# Patient Record
Sex: Male | Born: 1950 | Race: Black or African American | Hispanic: No | Marital: Single | Smoking: Current every day smoker
Health system: Southern US, Community
[De-identification: ages and names within clinical notes are randomized; demographics above are authoritative.]

---

## 2005-08-05 DIAGNOSIS — C189 Malignant neoplasm of colon, unspecified: Secondary | ICD-10-CM

## 2005-08-05 HISTORY — DX: Malignant neoplasm of colon, unspecified: C18.9

## 2006-02-06 ENCOUNTER — Emergency Department (HOSPITAL_COMMUNITY): Admission: EM | Admit: 2006-02-06 | Discharge: 2006-02-06 | Payer: Self-pay | Admitting: Emergency Medicine

## 2006-02-07 ENCOUNTER — Emergency Department (HOSPITAL_COMMUNITY): Admission: EM | Admit: 2006-02-07 | Discharge: 2006-02-07 | Payer: Self-pay | Admitting: Emergency Medicine

## 2006-02-13 ENCOUNTER — Emergency Department (HOSPITAL_COMMUNITY): Admission: EM | Admit: 2006-02-13 | Discharge: 2006-02-13 | Payer: Self-pay | Admitting: Emergency Medicine

## 2009-06-15 ENCOUNTER — Emergency Department (HOSPITAL_COMMUNITY): Admission: EM | Admit: 2009-06-15 | Discharge: 2009-06-16 | Payer: Self-pay | Admitting: Emergency Medicine

## 2020-09-26 ENCOUNTER — Emergency Department (HOSPITAL_COMMUNITY): Payer: Medicare Other

## 2020-09-26 ENCOUNTER — Emergency Department (HOSPITAL_COMMUNITY)
Admission: EM | Admit: 2020-09-26 | Discharge: 2020-09-26 | Disposition: A | Discharge status: Home / Self Care | Payer: Medicare Other | Attending: Emergency Medicine | Admitting: Emergency Medicine

## 2020-09-26 DIAGNOSIS — G4730 Sleep apnea, unspecified: Secondary | ICD-10-CM | POA: Diagnosis not present

## 2020-09-26 DIAGNOSIS — T50901A Poisoning by unspecified drugs, medicaments and biological substances, accidental (unintentional), initial encounter: Secondary | ICD-10-CM

## 2020-09-26 DIAGNOSIS — T480X1A Poisoning by oxytocic drugs, accidental (unintentional), initial encounter: Secondary | ICD-10-CM | POA: Insufficient documentation

## 2020-09-26 LAB — CBC WITH DIFFERENTIAL/PLATELET
Abs Immature Granulocytes: 0.18 10*3/uL — ABNORMAL HIGH (ref 0.00–0.07)
Basophils Absolute: 0.1 10*3/uL (ref 0.0–0.1)
Basophils Relative: 1 %
Eosinophils Absolute: 0 10*3/uL (ref 0.0–0.5)
Eosinophils Relative: 0 %
HCT: 41.9 % (ref 39.0–52.0)
Hemoglobin: 13.5 g/dL (ref 13.0–17.0)
Immature Granulocytes: 1 %
Lymphocytes Relative: 5 %
Lymphs Abs: 0.6 10*3/uL — ABNORMAL LOW (ref 0.7–4.0)
MCH: 29.8 pg (ref 26.0–34.0)
MCHC: 32.2 g/dL (ref 30.0–36.0)
MCV: 92.5 fL (ref 80.0–100.0)
Monocytes Absolute: 0.7 10*3/uL (ref 0.1–1.0)
Monocytes Relative: 5 %
Neutro Abs: 12.3 10*3/uL — ABNORMAL HIGH (ref 1.7–7.7)
Neutrophils Relative %: 88 %
Platelets: 224 10*3/uL (ref 150–400)
RBC: 4.53 MIL/uL (ref 4.22–5.81)
RDW: 12.5 % (ref 11.5–15.5)
WBC: 13.9 10*3/uL — ABNORMAL HIGH (ref 4.0–10.5)
nRBC: 0 % (ref 0.0–0.2)

## 2020-09-26 LAB — COMPREHENSIVE METABOLIC PANEL
ALT: 16 U/L (ref 0–44)
AST: 19 U/L (ref 15–41)
Albumin: 3.7 g/dL (ref 3.5–5.0)
Alkaline Phosphatase: 73 U/L (ref 38–126)
Anion gap: 8 (ref 5–15)
BUN: 10 mg/dL (ref 8–23)
CO2: 28 mmol/L (ref 22–32)
Calcium: 8.8 mg/dL — ABNORMAL LOW (ref 8.9–10.3)
Chloride: 101 mmol/L (ref 98–111)
Creatinine, Ser: 0.98 mg/dL (ref 0.61–1.24)
GFR, Estimated: >60 mL/min (ref >60)
Glucose, Bld: 148 mg/dL — ABNORMAL HIGH (ref 70–99)
Potassium: 4 mmol/L (ref 3.5–5.1)
Sodium: 137 mmol/L (ref 135–145)
Total Bilirubin: 0.3 mg/dL (ref 0.3–1.2)
Total Protein: 6.6 g/dL (ref 6.5–8.1)

## 2020-09-26 LAB — RAPID URINE DRUG SCREEN, HOSP PERFORMED
Amphetamines: NOT DETECTED
Barbiturates: NOT DETECTED
Benzodiazepines: NOT DETECTED
Cocaine: POSITIVE — AB
Opiates: NOT DETECTED
Tetrahydrocannabinol: POSITIVE — AB

## 2020-09-26 LAB — ETHANOL: Alcohol, Ethyl (B): <10 mg/dL (ref <10)

## 2020-09-26 NOTE — ED Triage Notes (Addendum)
Pt arrives from home via EMS. Pt's wife called EMS due to pt becoming altered, then unresponsive followed by agonal breathing. EMS reports agonal breathing, pin point pupils and unresponsiveness upon their arrival. EMS initiated BVM, 2 mg narcan IN. After several minutes pt woke up and is a/o x4 upon arrival to ED. Pt admits to accidental overdose on oxycodone though he states he only took 1/2 of a 10 mg tab. Pt is not prescribed oxycodone and wife is unaware that pt had taken medication or where he got it. Most recent vitals: BP 162/96 P 86 RR 18 CBG 150

## 2020-09-26 NOTE — ED Provider Notes (Signed)
[facility]ERGENCY DEPARTMENT Provider Note  CSN: [id number] Arrival date & time: 09/26/20 1505    History Chief Complaint  Patient presents with  . Ingestion    HPI  Philip Brooks is a 70 y.o. male with no significant PMH reports he took 1/4 of a friend's 10mg  oxycodone earlier this afternoon. He was found unresponsive by his wife who called EMS. EMS reports they found him with pinpoint pupils, agonal respirations, unresponsive. He was given Narcan with BVM until he regained consciousness. Unknown downtime but currently awake and alert without complaints. He states he is not a regular user of pain medications, does not use IV drugs. No other co-ingestions today. No SI.    No past medical history on file.  No family history on file.      Home Medications Prior to Admission medications   Not on File     Allergies    Patient has no known allergies.   Review of Systems   Review of Systems A comprehensive review of systems was completed and negative except as noted in HPI.    Physical Exam BP (!) 142/64 (BP Location: Right Arm)   Pulse 73   Temp 97.8 F (36.6 C) (Oral)   Resp 18   SpO2 90%   Physical Exam Vitals and nursing note reviewed.  Constitutional:      Appearance: Normal appearance.  HENT:     Head: Normocephalic and atraumatic.     Nose: Nose normal.     Mouth/Throat:     Mouth: Mucous membranes are moist.  Eyes:     Extraocular Movements: Extraocular movements intact.     Conjunctiva/sclera: Conjunctivae normal.  Cardiovascular:     Rate and Rhythm: Normal rate.  Pulmonary:     Effort: Pulmonary effort is normal.     Breath sounds: Normal breath sounds.  Abdominal:     General: Abdomen is flat.     Palpations: Abdomen is soft.     Tenderness: There is no abdominal tenderness.  Musculoskeletal:        General: No swelling. Normal range of motion.     Cervical back: Neck supple.  Skin:    General: Skin is warm and dry.  Neurological:      General: No focal deficit present.     Mental Status: He is alert.  Psychiatric:        Mood and Affect: Mood normal.      ED Results / Procedures / Treatments   Labs (all labs ordered are listed, but only abnormal results are displayed) Labs Reviewed  COMPREHENSIVE METABOLIC PANEL - Abnormal; Notable for the following components:      Result Value   Glucose, Bld 148 (*)    Calcium 8.8 (*)    All other components within normal limits  RAPID URINE DRUG SCREEN, HOSP PERFORMED - Abnormal; Notable for the following components:   Cocaine POSITIVE (*)    Tetrahydrocannabinol POSITIVE (*)    All other components within normal limits  CBC WITH DIFFERENTIAL/PLATELET - Abnormal; Notable for the following components:   WBC 13.9 (*)    Neutro Abs 12.3 (*)    Lymphs Abs 0.6 (*)    Abs Immature Granulocytes 0.18 (*)    All other components within normal limits  ETHANOL    EKG EKG Interpretation  Date/Time:  Tuesday September 26 2020 15:19:25 EST Ventricular Rate:  72 PR Interval:    QRS Duration: 166 QT Interval:  458 QTC Calculation: 502  R Axis:   41 Text Interpretation: Sinus rhythm Consider left atrial enlargement Left bundle branch block No old tracing to compare Confirmed by Calvert Cantor [phone]) on 09/26/2020 3:21:12 PM Also confirmed by Calvert Cantor [phone]), editor Hattie Perch [phone])  on 09/27/2020 6:46:41 AM   Radiology DG Chest 2 View  Result Date: 09/26/2020 CLINICAL DATA:  Overdose. EXAM: CHEST - 2 VIEW COMPARISON:  None. FINDINGS: The heart size and mediastinal contours are within normal limits. Both lungs are clear. The visualized skeletal structures are unremarkable. IMPRESSION: No active cardiopulmonary disease. Electronically Signed   By: Marijo Conception M.D.   On: 09/26/2020 16:35    Procedures Procedures  Medications Ordered in the ED Medications - No data to display   MDM Rules/Calculators/A&P MDM Patient now awake and alert after  accidental oral opiate overdose. Will check labs, CXR to check for aspiration and monitor for recurrence of somnolence when Narcan wears off.  ED Course  I have reviewed the triage vital signs and the nursing notes.  Pertinent labs & imaging results that were available during my care of the patient were reviewed by me and considered in my medical decision making (see chart for details).  Clinical Course as of 09/27/20 0759  Tue Sep 26, 2020  1714 CXR is normal.  [CS]  1638 CBC with mild leukocytosis. [CS]  4536 Per RN, patient falling asleep and difficult to arouse, but on my re-evaluation he is sitting up awake and talking. No current indication for re-dose of Narcan.  [CS]  2004 Patient continues to have brief episodes of desaturation when he falls asleep but easily aroused and returns to normal. Do not feel he is still over narcotized. He states he is 'just tired' from working two jobs and chasing his great grand children. Denies any fever, cough, CP or SOB. CXR was clear. Will check ambulatory saturation on RA.  [CS]  2039 I personally observed patient for a time when he had fallen asleep. He is having periods of sleep apnea, but recovers quickly. This is likely a chronic problem for him, not recognized because he does not go to the doctor. No indication for admission to the hospital and he would like to go home. Advised to establish with PCP, avoid opiate use and RTED for any other concerns.  [CS]  2129 UDS positive for cocaine and THC. Not positive for opiates.  [CS]    Clinical Course User Index [CS] Truddie Hidden, MD    Final Clinical Impression(s) / ED Diagnoses Final diagnoses:  Accidental overdose, initial encounter  Sleep apnea, unspecified type    Rx / DC Orders ED Discharge Orders    None       Truddie Hidden, MD 09/27/20 0800

## 2020-09-26 NOTE — ED Notes (Signed)
Pt ambulated to restroom, slightly unsteady on feet, but able to walk without assistance and without drop in oxygen saturation. Urine sample collected. Upon returned to room, pt fell asleep and began to desat again, pt kept awake by family member at bedside.

## 2021-06-17 ENCOUNTER — Other Ambulatory Visit: Payer: Self-pay

## 2021-06-17 ENCOUNTER — Emergency Department (HOSPITAL_COMMUNITY): Payer: Medicare Other

## 2021-06-17 ENCOUNTER — Encounter (HOSPITAL_COMMUNITY): Payer: Self-pay | Admitting: Internal Medicine

## 2021-06-17 ENCOUNTER — Inpatient Hospital Stay (HOSPITAL_COMMUNITY)
Admission: EM | Admit: 2021-06-17 | Discharge: 2021-06-19 | DRG: 042 | Disposition: A | Discharge status: Home / Self Care | Payer: Medicare Other | Attending: Internal Medicine | Admitting: Internal Medicine

## 2021-06-17 DIAGNOSIS — Z923 Personal history of irradiation: Secondary | ICD-10-CM | POA: Diagnosis not present

## 2021-06-17 DIAGNOSIS — F1410 Cocaine abuse, uncomplicated: Secondary | ICD-10-CM | POA: Diagnosis present

## 2021-06-17 DIAGNOSIS — Z9221 Personal history of antineoplastic chemotherapy: Secondary | ICD-10-CM

## 2021-06-17 DIAGNOSIS — Z20822 Contact with and (suspected) exposure to covid-19: Secondary | ICD-10-CM | POA: Diagnosis present

## 2021-06-17 DIAGNOSIS — H5500 Unspecified nystagmus: Secondary | ICD-10-CM | POA: Diagnosis present

## 2021-06-17 DIAGNOSIS — I639 Cerebral infarction, unspecified: Secondary | ICD-10-CM

## 2021-06-17 DIAGNOSIS — Z85038 Personal history of other malignant neoplasm of large intestine: Secondary | ICD-10-CM

## 2021-06-17 DIAGNOSIS — Z91011 Allergy to milk products: Secondary | ICD-10-CM | POA: Diagnosis not present

## 2021-06-17 DIAGNOSIS — E7800 Pure hypercholesterolemia, unspecified: Secondary | ICD-10-CM | POA: Diagnosis not present

## 2021-06-17 DIAGNOSIS — G8191 Hemiplegia, unspecified affecting right dominant side: Secondary | ICD-10-CM | POA: Diagnosis present

## 2021-06-17 DIAGNOSIS — F17210 Nicotine dependence, cigarettes, uncomplicated: Secondary | ICD-10-CM | POA: Diagnosis present

## 2021-06-17 DIAGNOSIS — R4701 Aphasia: Secondary | ICD-10-CM | POA: Diagnosis present

## 2021-06-17 DIAGNOSIS — I6523 Occlusion and stenosis of bilateral carotid arteries: Secondary | ICD-10-CM | POA: Diagnosis not present

## 2021-06-17 DIAGNOSIS — E785 Hyperlipidemia, unspecified: Secondary | ICD-10-CM | POA: Diagnosis present

## 2021-06-17 DIAGNOSIS — R21 Rash and other nonspecific skin eruption: Secondary | ICD-10-CM | POA: Diagnosis present

## 2021-06-17 DIAGNOSIS — Z809 Family history of malignant neoplasm, unspecified: Secondary | ICD-10-CM

## 2021-06-17 DIAGNOSIS — I6522 Occlusion and stenosis of left carotid artery: Secondary | ICD-10-CM | POA: Diagnosis not present

## 2021-06-17 DIAGNOSIS — I10 Essential (primary) hypertension: Secondary | ICD-10-CM | POA: Diagnosis present

## 2021-06-17 DIAGNOSIS — I63412 Cerebral infarction due to embolism of left middle cerebral artery: Secondary | ICD-10-CM | POA: Diagnosis present

## 2021-06-17 DIAGNOSIS — I6389 Other cerebral infarction: Secondary | ICD-10-CM | POA: Diagnosis not present

## 2021-06-17 DIAGNOSIS — G8321 Monoplegia of upper limb affecting right dominant side: Secondary | ICD-10-CM | POA: Diagnosis present

## 2021-06-17 LAB — DIFFERENTIAL
Abs Immature Granulocytes: 0.02 10*3/uL (ref 0.00–0.07)
Basophils Absolute: 0.1 10*3/uL (ref 0.0–0.1)
Basophils Relative: 1 %
Eosinophils Absolute: 0.1 10*3/uL (ref 0.0–0.5)
Eosinophils Relative: 2 %
Immature Granulocytes: 0 %
Lymphocytes Relative: 20 %
Lymphs Abs: 1.4 10*3/uL (ref 0.7–4.0)
Monocytes Absolute: 0.5 10*3/uL (ref 0.1–1.0)
Monocytes Relative: 6 %
Neutro Abs: 5 10*3/uL (ref 1.7–7.7)
Neutrophils Relative %: 71 %

## 2021-06-17 LAB — URINALYSIS, ROUTINE W REFLEX MICROSCOPIC
Bilirubin Urine: NEGATIVE
Glucose, UA: NEGATIVE mg/dL
Hgb urine dipstick: NEGATIVE
Ketones, ur: NEGATIVE mg/dL
Leukocytes,Ua: NEGATIVE
Nitrite: NEGATIVE
Protein, ur: NEGATIVE mg/dL
Specific Gravity, Urine: >1.046 — ABNORMAL HIGH (ref 1.005–1.030)
pH: 7 (ref 5.0–8.0)

## 2021-06-17 LAB — I-STAT CHEM 8, ED
BUN: 20 mg/dL (ref 8–23)
Calcium, Ion: 1.11 mmol/L — ABNORMAL LOW (ref 1.15–1.40)
Chloride: 101 mmol/L (ref 98–111)
Creatinine, Ser: 0.9 mg/dL (ref 0.61–1.24)
Glucose, Bld: 98 mg/dL (ref 70–99)
HCT: 43 % (ref 39.0–52.0)
Hemoglobin: 14.6 g/dL (ref 13.0–17.0)
Potassium: 6.8 mmol/L (ref 3.5–5.1)
Sodium: 139 mmol/L (ref 135–145)
TCO2: 32 mmol/L (ref 22–32)

## 2021-06-17 LAB — COMPREHENSIVE METABOLIC PANEL
ALT: 14 U/L (ref 0–44)
AST: 18 U/L (ref 15–41)
Albumin: 3.5 g/dL (ref 3.5–5.0)
Alkaline Phosphatase: 62 U/L (ref 38–126)
Anion gap: 8 (ref 5–15)
BUN: 12 mg/dL (ref 8–23)
CO2: 28 mmol/L (ref 22–32)
Calcium: 8.9 mg/dL (ref 8.9–10.3)
Chloride: 101 mmol/L (ref 98–111)
Creatinine, Ser: 1 mg/dL (ref 0.61–1.24)
GFR, Estimated: >60 mL/min (ref >60)
Glucose, Bld: 94 mg/dL (ref 70–99)
Potassium: 3.9 mmol/L (ref 3.5–5.1)
Sodium: 137 mmol/L (ref 135–145)
Total Bilirubin: 0.7 mg/dL (ref 0.3–1.2)
Total Protein: 6.6 g/dL (ref 6.5–8.1)

## 2021-06-17 LAB — RESP PANEL BY RT-PCR (FLU A&B, COVID) ARPGX2
Influenza A by PCR: NEGATIVE
Influenza B by PCR: NEGATIVE
SARS Coronavirus 2 by RT PCR: NEGATIVE

## 2021-06-17 LAB — APTT: aPTT: 27 seconds (ref 24–36)

## 2021-06-17 LAB — CBG MONITORING, ED: Glucose-Capillary: 99 mg/dL (ref 70–99)

## 2021-06-17 LAB — CBC
HCT: 42.2 % (ref 39.0–52.0)
Hemoglobin: 14.1 g/dL (ref 13.0–17.0)
MCH: 29.7 pg (ref 26.0–34.0)
MCHC: 33.4 g/dL (ref 30.0–36.0)
MCV: 89 fL (ref 80.0–100.0)
Platelets: 302 10*3/uL (ref 150–400)
RBC: 4.74 MIL/uL (ref 4.22–5.81)
RDW: 13.1 % (ref 11.5–15.5)
WBC: 7.1 10*3/uL (ref 4.0–10.5)
nRBC: 0 % (ref 0.0–0.2)

## 2021-06-17 LAB — ETHANOL: Alcohol, Ethyl (B): <10 mg/dL (ref <10)

## 2021-06-17 LAB — PROTIME-INR
INR: 1 (ref 0.8–1.2)
Prothrombin Time: 13 seconds (ref 11.4–15.2)

## 2021-06-17 MED ORDER — IOHEXOL 350 MG/ML SOLN
100.0000 mL | Freq: Once | INTRAVENOUS | Status: AC | PRN
  Administered 2021-06-17: 100 mL via INTRAVENOUS

## 2021-06-17 MED ORDER — ENOXAPARIN SODIUM 40 MG/0.4ML IJ SOSY
40.0000 mg | PREFILLED_SYRINGE | INTRAMUSCULAR | Status: DC
  Administered 2021-06-17 – 2021-06-19 (×3): 40 mg via SUBCUTANEOUS
  Filled 2021-06-17 (×3): qty 0.4

## 2021-06-17 MED ORDER — ASPIRIN 325 MG PO TABS
325.0000 mg | ORAL_TABLET | Freq: Once | ORAL | Status: AC
  Administered 2021-06-17: 325 mg via ORAL
  Filled 2021-06-17: qty 1

## 2021-06-17 MED ORDER — GADOBUTROL 1 MMOL/ML IV SOLN
7.5000 mL | Freq: Once | INTRAVENOUS | Status: AC | PRN
  Administered 2021-06-17: 7.5 mL via INTRAVENOUS

## 2021-06-17 MED ORDER — SODIUM CHLORIDE 0.9 % IV BOLUS
500.0000 mL | Freq: Once | INTRAVENOUS | Status: AC
  Administered 2021-06-17: 500 mL via INTRAVENOUS

## 2021-06-17 MED ORDER — SENNOSIDES-DOCUSATE SODIUM 8.6-50 MG PO TABS: 1.0000 | ORAL_TABLET | Freq: Every evening | ORAL | Status: DC | PRN

## 2021-06-17 MED ORDER — ASPIRIN EC 81 MG PO TBEC: 81.0000 mg | DELAYED_RELEASE_TABLET | Freq: Every day | ORAL | Status: DC

## 2021-06-17 MED ORDER — ACETAMINOPHEN 325 MG PO TABS: 650.0000 mg | ORAL_TABLET | Freq: Four times a day (QID) | ORAL | Status: DC | PRN

## 2021-06-17 MED ORDER — ASPIRIN 300 MG RE SUPP: 300.0000 mg | Freq: Once | RECTAL | Status: AC

## 2021-06-17 MED ORDER — CLOPIDOGREL BISULFATE 300 MG PO TABS
300.0000 mg | ORAL_TABLET | Freq: Once | ORAL | Status: AC
  Administered 2021-06-17: 300 mg via ORAL
  Filled 2021-06-17: qty 1

## 2021-06-17 MED ORDER — ASPIRIN 325 MG PO TABS: 325.0000 mg | ORAL_TABLET | Freq: Once | ORAL | Status: DC

## 2021-06-17 MED ORDER — ONDANSETRON HCL 4 MG/2ML IJ SOLN: 4.0000 mg | Freq: Four times a day (QID) | INTRAMUSCULAR | Status: DC | PRN

## 2021-06-17 MED ORDER — ONDANSETRON HCL 4 MG PO TABS: 4.0000 mg | ORAL_TABLET | Freq: Four times a day (QID) | ORAL | Status: DC | PRN

## 2021-06-17 MED ORDER — CLOPIDOGREL BISULFATE 75 MG PO TABS
75.0000 mg | ORAL_TABLET | Freq: Every day | ORAL | Status: DC
  Administered 2021-06-18 – 2021-06-19 (×2): 75 mg via ORAL
  Filled 2021-06-17 (×2): qty 1

## 2021-06-17 MED ORDER — ACETAMINOPHEN 650 MG RE SUPP: 650.0000 mg | Freq: Four times a day (QID) | RECTAL | Status: DC | PRN

## 2021-06-17 NOTE — ED Triage Notes (Signed)
Pt BIB EMS @ 1250 to ED. Per EMS pt had nausea, vomiting and confusion @ 2000 11/12, then R arm flaccidity @ 2200. No EMS call until this afternoon. Pt has R visual deficit, R facial droop, R arm flaccidity. PA called code stroke at 1300.

## 2021-06-17 NOTE — H&P (Signed)
Date: 06/17/2021               Patient Name:  Philip Brooks MRN: [id number]  DOB: 14-Jan-1951 Age / Sex: 70 y.o., male   PCP: Pcp, No         Medical Service: Internal Medicine Teaching Service         Attending Physician: Dr. Jimmye Norman, Elaina Pattee, MD    First Contact: Dr. Jeanice Lim Pager: [phone]  Second Contact: Dr. Coy Saunas Pager: [phone]       After Hours (After 5p/  First Contact Pager: [phone]  weekends / holidays): Second Contact Pager: [phone]   Chief Complaint: Right sided weakness  History of Present Illness:   Last night around 930PM patient had an episode of vomiting and confusion. Patient was "looking for the television" however, television that was right in front of him. Patient went outside to look for the television. He was redirected by his wife and daughter to come back into the house. Wife states, he "laughed to off" and got ready for bed. Afterwards, patient went to bed without any further complaints. According to wife, patient slept through the night without any issues. Patient states that he was well until 8AM this morning when he woke up, wife noticed he was stumbling in the kitchen. Patient states he experienced right arm weakness and slurred speech. Patient states he was unable to lift his right arm entirely. Wife called EMS, when they arrived, patient was sitting in the chair and required assisted transfer into the gurney.   Wife states that 1 year ago, patient experienced episode of starring of into the distance and his eyes were fixed in an upward gaze. This episode lasted a few minutes and patient would come to and not recall the incident. This episode occurred twice with each episode patient would come to and not recall the event. Patient did not seek medical attention at that time.  Patient denies being around any sick contacts. He denies any recent falls or syncopal episodes. However, patient does mention a rash present on left lateral side of abdomen.  He states the rash also presented on his left posterior thigh but has since resolved. He denies fever or chills.     Meds:  No outpatient medications have been marked as taking for the 06/17/21 encounter [facility] Encounter).     Allergies: Allergies as of 06/17/2021 - Review Complete 06/17/2021  Allergen Reaction Noted   Lactose Other (See Comments) 10/31/2011   Past Medical History:  Diagnosis Date   Colon cancer ([place]) 2007   S/P chemo/radiation/bowel resection   Colon cancer 2007; chem and radiation;   Family History: Mother: Diabetes; Father: Cancer  Social History: Patient is retired and was in [employer] for 20 years. Patient is a current smoker of 1 and 1/2 pack of cigarettes per day for the past 43 years. Patient denies alcohol use; no illicit drug use. Patient states he walks daily for exercise and endorse a diet consisting of eating whatever he wants.  Review of Systems  Constitutional:  Negative for chills and fever.  Eyes:  Negative for blurred vision.  Respiratory:  Negative for shortness of breath.   Cardiovascular:  Negative for chest pain, palpitations and leg swelling.  Gastrointestinal:  Negative for abdominal pain, constipation, diarrhea, nausea and vomiting.  Genitourinary:  Negative for dysuria.  Musculoskeletal:  Negative for falls.  Skin:  Positive for rash.  Neurological:  Positive for focal weakness. Negative for dizziness, loss of  consciousness and headaches.    Physical Exam: Blood pressure 133/82, pulse 72, temperature 98.5 F (36.9 C), temperature source Oral, resp. rate 17, height 5\' 7"  (1.702 m), weight 76.2 kg, SpO2 99 %. Physical Exam Constitutional:      General: He is not in acute distress. HENT:     Head: Normocephalic and atraumatic.     Mouth/Throat:     Dentition: Abnormal dentition.     Tongue: No lesions. Tongue does not deviate from midline.      Comments: Missing teeth along the top row (red circle) Eyes:     General:  Lids are normal. Visual field deficit present. No scleral icterus.    Extraocular Movements:     Right eye: Nystagmus present.     Left eye: Nystagmus present.  Cardiovascular:     Rate and Rhythm: Normal rate and regular rhythm.     Pulses:          Radial pulses are 2+ on the right side and 2+ on the left side.     Heart sounds: Normal heart sounds.  Pulmonary:     Effort: Pulmonary effort is normal.     Breath sounds: Normal air entry. Decreased breath sounds present.  Abdominal:     General: Abdomen is flat. Bowel sounds are normal.     Palpations: Abdomen is soft.     Tenderness: There is no abdominal tenderness.     Comments: Left lateral abdominal region- red raised scattered papules present  Musculoskeletal:     Right lower leg: No edema.     Left lower leg: No edema.     Comments: 0/5 strength of right arm; no sensation  5/5 strength of left arm; sensation intact   5/5 strength of lower extremities; sensation intact  Feet:     Right foot:     Skin integrity: Skin integrity normal.     Toenail Condition: Right toenails are normal.     Left foot:     Skin integrity: Skin integrity normal.  Lymphadenopathy:     Cervical: Cervical adenopathy present.     Right cervical: Superficial cervical adenopathy and deep cervical adenopathy present.     Left cervical: Superficial cervical adenopathy and deep cervical adenopathy present.  Skin:    General: Skin is warm and dry.  Neurological:     Mental Status: He is alert.     Cranial Nerves: No facial asymmetry.     Sensory: Sensory deficit present.     Coordination: Finger-Nose-Finger Test abnormal.     Comments: Horizontal nystagmus noted with rightward gaze. Speech was delayed in response to questions. Word finding when attempting to describe events. Patient unable to recall names of objects.  Psychiatric:        Speech: Speech is delayed.        Behavior: Behavior is cooperative.        Cognition and Memory: Cognition is  impaired. Memory is impaired.     EKG: personally reviewed my interpretation is sinus rhythm   CT HEAD WITHOUT CONTRAST   TECHNIQUE: Contiguous axial images were obtained from the base of the skull through the vertex without intravenous contrast.   COMPARISON:  No pertinent prior exams available for comparison.   FINDINGS: Brain:   Cerebral volume is normal.   Small age-indeterminate cortically based infarcts within the left frontal and parietal lobes (for instance as seen on series 2, image 20) (series 2, image 23). Additionally, there is an 11 mm focal subcortical  hypodensity within the left parietooccipital lobes which may reflect an age-indeterminate subcortical infarct (series 17, image 17) (series 5, image 56).   Background mild patchy and ill-defined hypoattenuation within the cerebral white matter, nonspecific but compatible chronic small vessel ischemic disease.   There is no acute intracranial hemorrhage.   No extra-axial fluid collection.   No evidence of an intracranial mass.   No midline shift.   Vascular: No hyperdense vessel.  Atherosclerotic calcifications.   Skull: No calvarial fracture. Nonspecific circumscribed lucent lesions within the left parietal calvarium and superior occipital calvarium measuring up to 15 mm.   Sinuses/Orbits: Visualized orbits show no acute finding. No significant paranasal sinus disease at the imaged levels.   ASPECTS ([place] Stroke Program Early CT Score)   - Ganglionic level infarction (caudate, lentiform nuclei, internal capsule, insula, M1-M3 cortex): 7   - Supraganglionic infarction (M4-M6 cortex): 1   Total score (0-10 with 10 being normal): 8   IMPRESSION: Small age-indeterminate left MCA territory cortically-based infarcts within the left frontal and parietal lobes. Additional 11 mm focal hypodensity within the left parietooccipital lobe subcortical white matter, which may reflect an age-indeterminate  white matter infarct. ASPECTS is 8. No evidence of acute intracranial hemorrhage.   Background mild chronic small vessel ischemic changes within the cerebral white matter.   Indeterminate circumscribed lucent lesions within the left parietal calvarium and superior midline occipital calvarium, measuring up to 15 mm. Short-interval 18-month non-contrast head CT follow-up recommended to ensure size stability.   CT ANGIOGRAPHY HEAD AND NECK   CT PERFUSION BRAIN   TECHNIQUE: Multidetector CT imaging of the head and neck was performed using the standard protocol during bolus administration of intravenous contrast. Multiplanar CT image reconstructions and MIPs were obtained to evaluate the vascular anatomy. Carotid stenosis measurements (when applicable) are obtained utilizing NASCET criteria, using the distal internal carotid diameter as the denominator.   Multiphase CT imaging of the brain was performed following IV bolus contrast injection. Subsequent parametric perfusion maps were calculated using RAPID software.   CONTRAST:  131mL OMNIPAQUE IOHEXOL 350 MG/ML SOLN   COMPARISON:  Noncontrast head CT performed earlier today 06/17/2021.   FINDINGS: CTA NECK FINDINGS   Aortic arch: Common origin of the innominate and left common carotid arteries. Atherosclerotic plaque within the visualized aortic arch and proximal major branch vessels of the neck. Streak and beam hardening artifact arising from a dense left-sided contrast bolus partially obscures the left subclavian artery. Within this limitation, there is no appreciable hemodynamically significant stenosis within the innominate or proximal subclavian arteries.   Right carotid system: CCA and ICA patent within the neck without stenosis. Minimal calcified plaque within the carotid bifurcation and proximal ICA.   Left carotid system: CCA and ICA patent within the neck without stenosis. Minimal calcified plaque within the  carotid bifurcation and proximal ICA.   Vertebral arteries: The non dominant right vertebral artery is patent within the neck without stenosis. Minimal nonstenotic calcified plaque at the origin of this vessel. The dominant left vertebral artery is patent within the neck. Streak and beam hardening artifact arising from a dense left-sided contrast bolus partially obscures the V1 left vertebral artery. Within this limitation, there is no appreciable hemodynamically significant stenosis within the left cervical vertebral artery.   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 Reversal of the expected cervical lordosis. Cervical spondylosis. No acute bony abnormality or aggressive osseous lesion. Partially imaged thoracic dextrocurvature.   Other neck: No neck mass or cervical lymphadenopathy.  Upper chest: Centrilobular and paraseptal emphysema. No consolidation within the imaged lung apices.   Review of the MIP images confirms the above findings   CTA HEAD FINDINGS   Anterior circulation:   The intracranial internal carotid arteries are patent. Calcified plaque within both vessels. Up to moderate stenosis within the distal cavernous/paraclinoid right ICA. Severe stenosis within the distal cavernous/paraclinoid left ICA. The M1 middle cerebral arteries are patent. Severe focal stenosis within a proximal to mid left M2 MCA vessel (series 12, image 31) (series 7, image 96). No right M2 proximal branch occlusion or high-grade proximal stenosis is identified. The anterior cerebral arteries are patent. No intracranial aneurysm is identified.   Posterior circulation:   The intracranial vertebral arteries are patent. Nonstenotic calcified plaque within the proximal V4 left vertebral artery. The basilar artery is patent. The posterior cerebral arteries are patent. A right posterior communicating artery is present. Minimal atherosclerotic irregularity of  the P2 left posterior cerebral artery. The left posterior communicating artery is hypoplastic or absent.   Venous sinuses: Within the limitations of contrast timing, no convincing thrombus.   Anatomic variants: As described.   Review of the MIP images confirms the above findings   CT Brain Perfusion Findings:   CBF (<30%) Volume: 62mL   Perfusion (Tmax>6.0s) volume: 21mL (however, there is a 5 mL region of hypoperfusion within the left frontoparietal lobes when using the Tmax greater than 4 seconds threshold).   Mismatch Volume: 62mL   Infarction Location:None identified   No emergent large vessel occlusion. Severe stenosis within the distal cavernous/paraclinoid left ICA. Severe stenosis within a proximal to mid M2 left MCA vessel. These results were communicated to Dr. Andrey Farmer 2:06 pmon 11/13/2022by text page via the [facility] messaging system.   IMPRESSION: CTA neck:   1. The common carotid and internal carotid arteries are patent within the neck without stenosis. Minimal calcified plaque within the carotid bifurcations and proximal ICAs, bilaterally. 2. Streak and beam hardening artifact arising from a dense left-sided contrast bolus partially obscures the V1 left vertebral artery. Within this limitation, the vertebral arteries are patent within the neck without appreciable stenosis.   CTA head:   1. No intracranial large vessel occlusion. 2. Intracranial atherosclerotic disease with multifocal stenoses, most notably as follows. 3. Severe stenosis within the distal cavernous/paraclinoid left ICA. 4. Severe stenosis within a proximal-to-mid M2 left MCA vessel. 5. Up to moderate stenosis within the distal cavernous/paraclinoid right ICA.   CT perfusion head:   The perfusion software identifies no core infarct. The perfusion software identifies no critically hypoperfused parenchyma utilizing the Tmax>6 seconds threshold. However, there is a 5 mL region  of hypoperfusion within the left frontoparietal lobes when utilizing the T-max greater than 4 seconds threshold. The perfusion software reports no mismatch volume.  MRI HEAD WITHOUT AND WITH CONTRAST   TECHNIQUE: Multiplanar, multiecho pulse sequences of the brain and surrounding structures were obtained without and with intravenous contrast.   CONTRAST:  7.17mL GADAVIST GADOBUTROL 1 MMOL/ML IV SOLN   COMPARISON:  Same day CT head.   FINDINGS: Brain: Multiple small acute infarcts throughout the left frontal and parietal cortex and white matter. Additional scattered moderate T2/FLAIR hyperintensities in the white matter, nonspecific but compatible with chronic microvascular ischemic disease. No evidence of acute hemorrhage, hydrocephalus, mass lesion, midline shift, or extra-axial fluid collection. No abnormal enhancement.   Vascular: Better evaluated on same day CTA.   Skull and upper cervical spine: Calvarial lesions described on same day CT head are  intrinsically T1 hyperintense. Degenerative change in the visualized upper cervical spine.   Sinuses/Orbits: Mild paranasal sinus mucosal thickening. Unremarkable orbits.   Other: No mastoid effusions.   IMPRESSION: 1. Multiple cortical and subcortical infarcts throughout the left frontal and parietal lobes, suggestive of watershed territory infarcts (both cortical and internal border zones). Mild associated edema without significant mass effect. 2. Moderate chronic microvascular ischemic disease. 3. Calvarial lesions described on same day CT head are intrinsically T1 hyperintense, probably benign. The previously recommended 3 month CT head follow up could confirm stability.     Assessment & Plan by Problem: Active Problems:   Acute ischemic stroke ([place])  Acute ischemic stroke Patient presents with complete loss of motor and sensory function of the right arm. MRI of head reveals multiple cortical and subcortical  infarcts throughout the left frontal and parietal lobes, suggestive of watershed territory infarcts. Patient was out of window for TPA treatment. Initial labs: CBC insignificant, WBC WNL; CMP insignificant, no electrolyte imbalance, Cr function WNL; Coags WNL; Etoh <10. Patient PMHx significant for colon cancer in 2007 requiring chemo/radiation. No history of HTN or diabetes. Patient denies any use of medications. Patient is a long term smoker of 43/PPD. COVID and Flu negative. On PE, patient has loss of motor and sensory function of right arm. Nystagmus on rightward gaze. Patient has been hypertensive since arrival SBP >150s. Possible risk factors for ischemic stroke for this patient: HTN, HLD, drug use and extensive smoking history. Patient denies drug use, despite prior history of accidental overdose and poor dentition. Patient has no PMHx and was last seen by a physician over 10 years ago. Patient admitted for further evaluation. --Neurology on board; pending reccs --HIV pending --UA pending --TSH pending --Lipid panel pending --A1c pending   Dispo: Admit patient to Observation with expected length of stay less than 2 midnights.  Signed: Timothy Lasso, MD 06/17/2021, 6:10 PM  Pager: [phone] After 5pm on weekdays and 1pm on weekends: On Call pager: [phone]

## 2021-06-17 NOTE — ED Notes (Signed)
Resting in stretcher with call light in reach, no distress noted. Neuro team rounded and spoke to pt and pt's wife, answered all questions. Wife left for the evening but plans to return tomorrow. Pt denies needs.

## 2021-06-17 NOTE — ED Notes (Signed)
Ordered house tray for pt

## 2021-06-17 NOTE — Code Documentation (Signed)
Stroke Response Nurse Documentation Code Documentation  CRAIGORY TOSTE is a 70 y.o. male arriving to [facility] ED via [facility] on 06/17/21 with history of polysubstance abuse including cocaine. On No antithrombotic. Code stroke was activated by ED PA (EMS, ED).   Patient from home where he was LKW at 2200 on 11/12 and now complaining of right sided weakness as well as mild visual disturbance .   Stroke team at the bedside on patient arrival. Labs drawn and patient cleared for CT by Dr. Matilde Sprang. Patient to CT with team. NIHSS 9, see documentation for details and code stroke times. Patient with right arm weakness and Visual  neglect on exam. The following imaging was completed:  CT and CTA (CT, CTA head and neck, CTP). Patient is not a candidate for IV Thrombolytic due to being outside of treatment window.  Care/Plan: neuro checks q2h x 12 hours, then q4h.   Bedside handoff with ED RN Peter Congo.    [place]  Stroke Response RN

## 2021-06-17 NOTE — Consult Note (Signed)
Neurology Consultation Reason for Consult: Code stroke Requesting Physician: Teressa Lower  CC: Right upper extremity weakness and confusion/aphasia  History is obtained from: Patient, wife, and ED provider  HPI: Philip Brooks is a 70 y.o. right-handed male with past medical history significant for colon cancer, no chronic home medications and no recent follow-up with medical care.  History is obtained primarily from his wife who reports that he was last in his normal state of health yesterday evening.  At 10 PM he had sudden onset emesis 3 times and then told her he could not find the TV that was in their bedroom right in front of him.  She wanted him to go to the ED for evaluation but he refused multiple times.  At that time he mostly had confusion, but she did not note any weakness.  At 8 AM when she discovered he could not move his right arm she became more insistent that he go to the ED for evaluation.  On arrival ED provider activated code stroke for concern for acute neurological changes within a 24-hour period.  Wife reports he had 1 similar episode 2 years ago where he was staring off and having some confusion, but this was much more severe.  She otherwise notes that he has had a rash on his left leg for about 3 weeks but no fevers.  Focused history was obtained in the setting of code stroke situation  09/26/2020 presented with cocaine overdose (endorsed taking a friend's oxycodone)  LKW: 10 PM on 11/12 tPA given?: No, out of the window IA performed?: No, no LVO Premorbid modified rankin scale:      0 - No symptoms.  ROS: Unable to obtain due to altered mental status; he has significant confusion and provides variable history to different providers.   No past medical history on file. Wife reports only history of colon cancer  No family history on file. Unable to obtain from patient given his mental status  Social History:  has no history on file for tobacco use, alcohol use, and  drug use. Wife notes he is an [employer] but does not go to his regular annual checkups at the [place]  Exam: Current vital signs: BP (!) 144/81 (BP Location: Right Arm)   Pulse 62   Temp 98.5 F (36.9 C) (Oral)   Resp 12   Ht 5\' 7"  (1.702 m)   Wt 76.2 kg   SpO2 98%   BMI 26.31 kg/m  Vital signs in last 24 hours: Temp:  [98.5 F (36.9 C)-98.6 F (37 C)] 98.5 F (36.9 C) (11/13 1532) Pulse Rate:  [55-66] 62 (11/13 1532) Resp:  [10-18] 12 (11/13 1532) BP: (126-144)/(70-85) 144/81 (11/13 1532) SpO2:  [97 %-98 %] 98 % (11/13 1532) Weight:  [76.2 kg] 76.2 kg (11/13 1346)   Physical Exam  Constitutional: Appears well-developed and well-nourished.  Psych: Affect appropriate to situation, calm and cooperative, initially was laughing inappropriately at times but on later reevaluation did seem appropriately concerned about his right upper extremity weakness Eyes: No scleral injection HENT: No oropharyngeal obstruction.  MSK: no joint deformities.  Cardiovascular: Normal rate and regular rhythm.  Respiratory: Effort normal, non-labored breathing GI: Soft.  No distension. There is no tenderness.  Skin: Warm dry and intact visible skin  Neuro: Mental Status: Patient is awake, alert, oriented to person, place, month, and age.  Patient is unable to give much history. Has some intermittent difficulty naming, at times just doesn't answer questions or  laughs. Neglects the right side Cranial Nerves: II: Visual Fields are full on my eval.  III,IV, VI: EOMI without ptosis or diploplia, though gazes more often to the left than the right V: Facial sensation is symmetric to eyelash brush VII: Facial movement is notable for subtle right facial droop VIII: hearing is intact to voice XII: tongue is midline without atrophy or fasciculations.  Motor: Tone is normal. Bulk is normal.  Trace movement in the right upper extremity.  No drift in any of the other extremities, although he does move  the left leg slightly more briskly than the right leg Sensory: Sensation is symmetric to light touch in the arms and legs but he extinguishes to double simultaneous stimuli Plantars: Toes are downgoing bilaterally.  Cerebellar: FNF is intact on the left and HKS are intact bilaterally  NIHSS total 6 Score breakdown: One-point for right facial droop, 3 points for right upper extremity weakness, one-point for language impairment and one-point for neglect of the right side  I have reviewed labs in epic and the results pertinent to this consultation are:  Basic Metabolic Panel: Recent Labs  Lab 06/17/21 1316  NA 139  K 6.8*  CL 101  GLUCOSE 98  BUN 20  CREATININE 0.90    CBC: Recent Labs  Lab 06/17/21 1305 06/17/21 1316  WBC 7.1  --   NEUTROABS 5.0  --   HGB 14.1 14.6  HCT 42.2 43.0  MCV 89.0  --   PLT 302  --     Coagulation Studies: Recent Labs    06/17/21 1305  LABPROT 13.0  INR 1.0      I have reviewed the images obtained:  Head CT concerning for infarcts in the left MCA territory CTA/CT perfusion without clearly identified core infarct or tissue at risk MRI brain with a watershed pattern of diffusion restriction in the left MCA territory  Full radiology reads:  Head CT Small age-indeterminate left MCA territory cortically-based infarcts within the left frontal and parietal lobes. Additional 11 mm focal hypodensity within the left parietooccipital lobe subcortical white matter, which may reflect an age-indeterminate white matter infarct. ASPECTS is 8. No evidence of acute intracranial hemorrhage.  Background mild chronic small vessel ischemic changes within the cerebral white matter.  Indeterminate circumscribed lucent lesions within the left parietal calvarium and superior midline occipital calvarium, measuring up to 15 mm. Short-interval 64-month non-contrast head CT follow-up recommended to ensure size stability.   CTA neck:  1. The common  carotid and internal carotid arteries are patent within the neck without stenosis. Minimal calcified plaque within the carotid bifurcations and proximal ICAs, bilaterally. 2. Streak and beam hardening artifact arising from a dense left-sided contrast bolus partially obscures the V1 left vertebral artery. Within this limitation, the vertebral arteries are patent within the neck without appreciable stenosis.  CTA head:  1. No intracranial large vessel occlusion. 2. Intracranial atherosclerotic disease with multifocal stenoses, most notably as follows. 3. Severe stenosis within the distal cavernous/paraclinoid left ICA. 4. Severe stenosis within a proximal-to-mid M2 left MCA vessel. 5. Up to moderate stenosis within the distal cavernous/paraclinoid right ICA.  CT perfusion head:  The perfusion software identifies no core infarct. The perfusion software identifies no critically hypoperfused parenchyma utilizing the Tmax>6 seconds threshold. However, there is a 5 mL region of hypoperfusion within the left frontoparietal lobes when utilizing the T-max greater than 4 seconds threshold. The perfusion software reports no mismatch volume.  MRI Brain: 1. Multiple cortical and subcortical infarcts  throughout the left frontal and parietal lobes, suggestive of watershed territory infarcts (both cortical and internal border zones). Mild associated edema without significant mass effect. 2. Moderate chronic microvascular ischemic disease. 3. Calvarial lesions described on same day CT head are intrinsically T1 hyperintense, probably benign. The previously recommended 3 month CT head follow up could confirm stability.   Impression: Watershed distribution infarcts of the left MCA territory.  Etiology is most likely atheroembolic, though urine drug screen should be followed up as substance-use-induced vasculopathy would be on the differential given his history of cocaine and THC  use  Recommendations:  #Left MCA watershed stroke - Stroke labs HgbA1c, fasting lipid panel - Frequent neuro checks - Echocardiogram - Prophylactic therapy-Antiplatelet med: Aspirin - dose 325mg  PO or 300mg  PR, followed by 81 mg daily - Please order Plavix 300 mg load followed by 75 mg daily  - Pending lipid panel, initiate statin for LDL goal less than 70 - Risk factor modification - Telemetry monitoring - Blood pressure goal   - Permissive hypertension to 220/120  - PT consult, OT consult, Speech consult, unless patient is back to baseline - Stroke team to follow  [place] [phone] Available 7 AM to 7 PM, outside these hours please contact Neurologist on call listed on AMION

## 2021-06-17 NOTE — ED Provider Notes (Signed)
[facility] EMERGENCY DEPARTMENT Provider Note   CSN: [id number] Arrival date & time: 06/17/21  1251  An emergency department physician performed an initial assessment on this suspected stroke patient at 1310.  History Chief Complaint  Patient presents with   Code Stroke    SHIVAN HODES is a 70 y.o. male.  JAGJIT RINER is a 70 y.o. male with no known past medical history, who presents to the emergency department via EMS for evaluation of strokelike symptoms.  EMS reports per family at 14 PM last night patient had an episode where he was confused and had some nausea and vomiting, patient reports his family then went to bed, and at 10 PM he reported that his right arm was not working and he could not move it.  Symptoms have been persistent since last night.  No known previous stroke history.  With EMS they noted right upper extremity weakness, no lower extremity weakness and patient was able to stand and pivot onto the stretcher.  No other deficits noted on their exam but they did report the patient seemed to be cognitively slowed and had difficulty answering some questions.  Patient reports he has not noticed any changes to his vision, headaches, dizziness, and has not had any continued vomiting, reports weakness only in his right arm.  No associated chest pain, shortness of breath, abdominal pain, fevers or chills.  No pain or injury to the right arm.  No other aggravating or alleviating factors.  The history is provided by the patient and the EMS personnel.      No past medical history on file.  There are no problems to display for this patient.        No family history on file.     Home Medications Prior to Admission medications   Not on File    Allergies    Patient has no known allergies.  Review of Systems   Review of Systems  Constitutional:  Negative for chills and fever.  HENT: Negative.    Eyes:  Negative for visual disturbance.   Respiratory:  Negative for cough and shortness of breath.   Cardiovascular:  Negative for chest pain.  Gastrointestinal:  Positive for nausea and vomiting. Negative for abdominal pain.  Musculoskeletal:  Negative for arthralgias and myalgias.  Skin:  Negative for color change and rash.  Neurological:  Positive for weakness. Negative for dizziness, seizures, facial asymmetry, speech difficulty, light-headedness and headaches.  Psychiatric/Behavioral:  Positive for confusion.   All other systems reviewed and are negative.  Physical Exam Updated Vital Signs BP 139/70 (BP Location: Right Arm)   Pulse 66   Temp 98.6 F (37 C) (Oral)   Resp 18   Ht 5\' 7"  (1.702 m)   Wt 76.2 kg   SpO2 97%   BMI 26.31 kg/m   Physical Exam Vitals and nursing note reviewed.  Constitutional:      General: He is not in acute distress.    Appearance: Normal appearance. He is well-developed and normal weight. He is not ill-appearing or diaphoretic.  HENT:     Head: Normocephalic and atraumatic.  Eyes:     General:        Right eye: No discharge.        Left eye: No discharge.     Pupils: Pupils are equal, round, and reactive to light.     Comments: PERRLA, EOMI, patient with right peripheral visual field cut  Cardiovascular:  Rate and Rhythm: Normal rate and regular rhythm.     Pulses: Normal pulses.     Heart sounds: Normal heart sounds.  Pulmonary:     Effort: Pulmonary effort is normal. No respiratory distress.     Breath sounds: Normal breath sounds. No wheezing or rales.     Comments: Respirations equal and unlabored, patient able to speak in full sentences, lungs clear to auscultation bilaterally  Abdominal:     General: Bowel sounds are normal. There is no distension.     Palpations: Abdomen is soft. There is no mass.     Tenderness: There is no abdominal tenderness. There is no guarding.     Comments: Abdomen soft, nondistended, nontender to palpation in all quadrants without guarding or  peritoneal signs  Musculoskeletal:        General: No deformity.     Cervical back: Neck supple.  Skin:    General: Skin is warm and dry.     Capillary Refill: Capillary refill takes less than 2 seconds.  Neurological:     Mental Status: He is alert and oriented to person, place, and time.     Coordination: Coordination normal.     Comments: Speech is clear without dysarthria but patient has difficulty answering questions, cognition is slowed, able to follow commands but sometimes requires repeated instruction Mild right-sided facial droop noted Patient has right temporal visual field deficit, PERRLA, EOMI Patient has flaccid paralysis of the right upper extremity and is unable to lift it at all, all other extremities with 5/5 strength. Sensation intact in all extremities. Finger-to-nose intact with the left upper extremity  Psychiatric:        Mood and Affect: Mood normal.        Behavior: Behavior normal.    ED Results / Procedures / Treatments   Labs (all labs ordered are listed, but only abnormal results are displayed) Labs Reviewed  I-STAT CHEM 8, ED - Abnormal; Notable for the following components:      Result Value   Potassium 6.8 (*)    Calcium, Ion 1.11 (*)    All other components within normal limits  RESP PANEL BY RT-PCR (FLU A&B, COVID) ARPGX2  ETHANOL  PROTIME-INR  APTT  CBC  DIFFERENTIAL  COMPREHENSIVE METABOLIC PANEL  RAPID URINE DRUG SCREEN, HOSP PERFORMED  URINALYSIS, ROUTINE W REFLEX MICROSCOPIC  CBG MONITORING, ED    EKG None  Radiology CT HEAD CODE STROKE WO CONTRAST  Addendum Date: 06/17/2021   ADDENDUM REPORT: 06/17/2021 13:42 ADDENDUM: These results were called by telephone at the time of interpretation on 06/17/2021 at 1:40 pm to provider Dr. Curly Shores, who verbally acknowledged these results. Electronically Signed   By: Kellie Simmering D.O.   On: 06/17/2021 13:42   Result Date: 06/17/2021 CLINICAL DATA:  Code stroke. Neuro deficit, acute, stroke  suspected. EXAM: CT HEAD WITHOUT CONTRAST TECHNIQUE: Contiguous axial images were obtained from the base of the skull through the vertex without intravenous contrast. COMPARISON:  No pertinent prior exams available for comparison. FINDINGS: Brain: Cerebral volume is normal. Small age-indeterminate cortically based infarcts within the left frontal and parietal lobes (for instance as seen on series 2, image 20) (series 2, image 23). Additionally, there is an 11 mm focal subcortical hypodensity within the left parietooccipital lobes which may reflect an age-indeterminate subcortical infarct (series 17, image 17) (series 5, image 56). Background mild patchy and ill-defined hypoattenuation within the cerebral white matter, nonspecific but compatible chronic small vessel ischemic disease. There is  no acute intracranial hemorrhage. No extra-axial fluid collection. No evidence of an intracranial mass. No midline shift. Vascular: No hyperdense vessel.  Atherosclerotic calcifications. Skull: No calvarial fracture. Nonspecific circumscribed lucent lesions within the left parietal calvarium and superior occipital calvarium measuring up to 15 mm. Sinuses/Orbits: Visualized orbits show no acute finding. No significant paranasal sinus disease at the imaged levels. ASPECTS ([place] Stroke Program Early CT Score) - Ganglionic level infarction (caudate, lentiform nuclei, internal capsule, insula, M1-M3 cortex): 7 - Supraganglionic infarction (M4-M6 cortex): 1 Total score (0-10 with 10 being normal): 8 IMPRESSION: Small age-indeterminate left MCA territory cortically-based infarcts within the left frontal and parietal lobes. Additional 11 mm focal hypodensity within the left parietooccipital lobe subcortical white matter, which may reflect an age-indeterminate white matter infarct. ASPECTS is 8. No evidence of acute intracranial hemorrhage. Background mild chronic small vessel ischemic changes within the cerebral white matter.  Indeterminate circumscribed lucent lesions within the left parietal calvarium and superior midline occipital calvarium, measuring up to 15 mm. Short-interval 45-month non-contrast head CT follow-up recommended to ensure size stability. Electronically Signed: By: Kellie Simmering D.O. On: 06/17/2021 13:38    Procedures .Critical Care Performed by: Jacqlyn Larsen, PA-C Authorized by: Jacqlyn Larsen, PA-C   Critical care provider statement:    Critical care time (minutes):  45   Critical care was necessary to treat or prevent imminent or life-threatening deterioration of the following conditions:  CNS failure or compromise   Critical care was time spent personally by me on the following activities:  Blood draw for specimens, development of treatment plan with patient or surrogate, discussions with consultants, evaluation of patient's response to treatment, examination of patient, obtaining history from patient or surrogate, ordering and performing treatments and interventions, ordering and review of laboratory studies, ordering and review of radiographic studies, pulse oximetry, re-evaluation of patient's condition and review of old charts   Care discussed with: admitting provider     Medications Ordered in ED Medications  iohexol (OMNIPAQUE) 350 MG/ML injection 100 mL (100 mLs Intravenous Contrast Given 06/17/21 1340)    ED Course  I have reviewed the triage vital signs and the nursing notes.  Pertinent labs & imaging results that were available during my care of the patient were reviewed by me and considered in my medical decision making (see chart for details).    MDM Rules/Calculators/A&P                           70 year old male presents with right upper extremity weakness, symptoms started between 8 PM and 10 PM last night, on my exam he also has a right-sided facial droop and right-sided visual field deficit, with these additional deficits patient is Lucianne Lei positive, concern for possible LVO  so patient was activated as a code stroke because he is within the 24-hour window, he is not a candidate for TN K as symptoms have been present for greater than 4 hours.  Neurology notified and evaluated patient at bedside and patient was taken directly to CT.  Labs without evidence of leukocytosis or anemia, on i-STAT Chem-8 patient did have elevated potassium of 6.8, but I suspect this is due to hemolysis as labs were pulled off a line, will repeat metabolic panel, normal coags and negative ethanol level.  Number  CT of the head with small age-indeterminate left MCA territory cortically based infarcts within the left frontal and parietal lobes, no evidence of acute intracranial hemorrhage.  Patient also went underwent CTA perfusion study.  No intervention for LVO.  Neurology recommends MRI and medicine admission for stroke work-up and neurology will continue to consult.  Case discussed with internal medicine teaching service who will see and admit patient for stroke.  Final Clinical Impression(s) / ED Diagnoses Final diagnoses:  Acute ischemic stroke [facility])    Rx / DC Orders ED Discharge Orders     None        Janet Berlin 06/17/21 1449    Teressa Lower, MD 06/17/21 1616

## 2021-06-18 ENCOUNTER — Inpatient Hospital Stay (HOSPITAL_COMMUNITY): Payer: Medicare Other

## 2021-06-18 ENCOUNTER — Encounter (HOSPITAL_COMMUNITY): Payer: Self-pay | Admitting: Internal Medicine

## 2021-06-18 DIAGNOSIS — I6389 Other cerebral infarction: Secondary | ICD-10-CM

## 2021-06-18 DIAGNOSIS — E7800 Pure hypercholesterolemia, unspecified: Secondary | ICD-10-CM

## 2021-06-18 DIAGNOSIS — I6522 Occlusion and stenosis of left carotid artery: Secondary | ICD-10-CM

## 2021-06-18 DIAGNOSIS — I639 Cerebral infarction, unspecified: Secondary | ICD-10-CM

## 2021-06-18 HISTORY — PX: IR US GUIDE VASC ACCESS RIGHT: IMG2390

## 2021-06-18 HISTORY — PX: IR ANGIO VERTEBRAL SEL VERTEBRAL BILAT MOD SED: IMG5369

## 2021-06-18 HISTORY — PX: IR ANGIO INTRA EXTRACRAN SEL INTERNAL CAROTID BILAT MOD SED: IMG5363

## 2021-06-18 LAB — ECHOCARDIOGRAM COMPLETE
AR max vel: 2.46 cm2
AV Area VTI: 21.47 cm2
AV Area mean vel: 2.01 cm2
AV Mean grad: 4 mmHg
AV Peak grad: 6.8 mmHg
Ao pk vel: 1.3 m/s
Height: 67 in
S' Lateral: 2.4 cm
Weight: 2688 oz

## 2021-06-18 LAB — COMPREHENSIVE METABOLIC PANEL
ALT: 14 U/L (ref 0–44)
AST: 20 U/L (ref 15–41)
Albumin: 3.6 g/dL (ref 3.5–5.0)
Alkaline Phosphatase: 73 U/L (ref 38–126)
Anion gap: 9 (ref 5–15)
BUN: 13 mg/dL (ref 8–23)
CO2: 26 mmol/L (ref 22–32)
Calcium: 8.9 mg/dL (ref 8.9–10.3)
Chloride: 103 mmol/L (ref 98–111)
Creatinine, Ser: 1 mg/dL (ref 0.61–1.24)
GFR, Estimated: >60 mL/min (ref >60)
Glucose, Bld: 83 mg/dL (ref 70–99)
Potassium: 4.3 mmol/L (ref 3.5–5.1)
Sodium: 138 mmol/L (ref 135–145)
Total Bilirubin: 0.9 mg/dL (ref 0.3–1.2)
Total Protein: 6.7 g/dL (ref 6.5–8.1)

## 2021-06-18 LAB — CBC
HCT: 43.2 % (ref 39.0–52.0)
Hemoglobin: 14.3 g/dL (ref 13.0–17.0)
MCH: 29.7 pg (ref 26.0–34.0)
MCHC: 33.1 g/dL (ref 30.0–36.0)
MCV: 89.8 fL (ref 80.0–100.0)
Platelets: 229 10*3/uL (ref 150–400)
RBC: 4.81 MIL/uL (ref 4.22–5.81)
RDW: 12.7 % (ref 11.5–15.5)
WBC: 6.6 10*3/uL (ref 4.0–10.5)
nRBC: 0 % (ref 0.0–0.2)

## 2021-06-18 LAB — LIPID PANEL
Cholesterol: 192 mg/dL (ref 0–200)
HDL: 44 mg/dL (ref >40)
LDL Cholesterol: 134 mg/dL — ABNORMAL HIGH (ref 0–99)
Total CHOL/HDL Ratio: 4.4 RATIO
Triglycerides: 69 mg/dL (ref <150)
VLDL: 14 mg/dL (ref 0–40)

## 2021-06-18 LAB — HEMOGLOBIN A1C
Hgb A1c MFr Bld: 5.4 % (ref 4.8–5.6)
Mean Plasma Glucose: 108.28 mg/dL

## 2021-06-18 MED ORDER — MIDAZOLAM HCL 2 MG/2ML IJ SOLN
INTRAMUSCULAR | Status: AC | PRN
  Administered 2021-06-18: 1 mg via INTRAVENOUS

## 2021-06-18 MED ORDER — MIDAZOLAM HCL 2 MG/2ML IJ SOLN
INTRAMUSCULAR | Status: AC
  Filled 2021-06-18: qty 2

## 2021-06-18 MED ORDER — TRAMADOL HCL 50 MG PO TABS: 50.0000 mg | ORAL_TABLET | Freq: Two times a day (BID) | ORAL | Status: DC | PRN

## 2021-06-18 MED ORDER — IOHEXOL 300 MG/ML  SOLN
100.0000 mL | Freq: Once | INTRAMUSCULAR | Status: AC | PRN
  Administered 2021-06-18: 50 mL via INTRA_ARTERIAL

## 2021-06-18 MED ORDER — IOHEXOL 300 MG/ML  SOLN
100.0000 mL | Freq: Once | INTRAMUSCULAR | Status: AC | PRN
  Administered 2021-06-18: 15 mL via INTRA_ARTERIAL

## 2021-06-18 MED ORDER — ASPIRIN EC 325 MG PO TBEC
325.0000 mg | DELAYED_RELEASE_TABLET | Freq: Every day | ORAL | Status: DC
  Administered 2021-06-18 – 2021-06-19 (×2): 325 mg via ORAL
  Filled 2021-06-18 (×2): qty 1

## 2021-06-18 MED ORDER — FENTANYL CITRATE (PF) 100 MCG/2ML IJ SOLN
INTRAMUSCULAR | Status: AC | PRN
  Administered 2021-06-18: 25 ug via INTRAVENOUS

## 2021-06-18 MED ORDER — LIDOCAINE HCL 1 % IJ SOLN
INTRAMUSCULAR | Status: AC
  Filled 2021-06-18: qty 20

## 2021-06-18 MED ORDER — ATORVASTATIN CALCIUM 40 MG PO TABS
40.0000 mg | ORAL_TABLET | Freq: Every day | ORAL | Status: DC
  Administered 2021-06-18 – 2021-06-19 (×2): 40 mg via ORAL
  Filled 2021-06-18 (×2): qty 1

## 2021-06-18 MED ORDER — FENTANYL CITRATE (PF) 100 MCG/2ML IJ SOLN
INTRAMUSCULAR | Status: AC
  Filled 2021-06-18: qty 2

## 2021-06-18 NOTE — Progress Notes (Signed)
   Subjective: I seen and evaluated Philip Brooks at bedside.  He states that he is feeling better than yesterday but not quite at his baseline.  He states improve function of his right arm.  He is able to raise his shoulders and he has sensation.  He expressed disappointment in his new diagnosis however, he is hopeful that he will make a full recovery.  Objective:  Vital signs in last 24 hours: Vitals:   06/18/21 0930 06/18/21 1000 06/18/21 1330 06/18/21 1455  BP: (!) 141/69  136/75 128/70  Pulse: 63  78 64  Resp: 11  18 17   Temp:  97.9 F (36.6 C)    TempSrc:  Oral    SpO2: 97%  98% 99%  Weight:      Height:         Assessment/Plan:  Active Problems:   Acute ischemic stroke (HCC)  Acute ischemic stroke Extensive work-up conducted to determine etiology of ischemic stroke.  A1c 5.4%; lipid panel significant for elevated LDL at 134; cholesterol 192.  Echo showed ejection fraction 50 to 55% no valvulopathy noted, mild right atrial enlargement.  Venous ultrasound of the lower extremity showed no evidence of DVT bilaterally.  On physical exam, patient was able to lift his right shoulder and sensation intact.  This is an improvement from previous day where he had total loss of motor and sensory function of right arm. --IR angio results pending --HIV pending --TSH pending --UDS pending --On DAPT --atorvastatin 40 mg daily --BP permissive to 220/120 --Continue telemetry --PT/OT on board    Prior to Admission Living Arrangement: Anticipated Discharge Location: Barriers to Discharge: Dispo: Anticipated discharge in approximately 1-2 day(s).   Timothy Lasso, MD 06/18/2021, 3:02 PM Pager: [phone] After 5pm on weekdays and 1pm on weekends: On Call pager [phone]

## 2021-06-18 NOTE — Progress Notes (Addendum)
STROKE TEAM PROGRESS NOTE   ATTENDING NOTE: I reviewed above note and agree with the assessment and plan. Pt was seen and examined.   70 year old male with history of colon cancer admitted for nausea vomiting 3 times, confusion, right arm weakness.  CT head showed left MCA cortical infarct, questionable old left parietal occipital small cortical infarct.  CTA head and neck bilateral ICA siphon stenosis left more than right.  Questionable left M2 stenosis.  CT perfusion negative.  MRI showed left MCA scattered infarcts.  EF 50 to 55%, LE venous Doppler no DVT.  Creatinine 1.00, A1c 5.4, LDL 134.  Discussed with Dr. Norma Fredrickson interventional neuroradiology, had cerebral angiogram done today showed left ICA siphon 50% stenosis.  Recommend maximal medical treatment at this time.  On exam, no family at bedside, pt lying in bed after procedure, awake, alert, eyes open, orientated to age, place, month, but attempted twice for year. No aphasia, paucity of speech, following all simple commands. Able to name and repeat. No gaze palsy, tracking bilaterally, visual field full but difficulty with right lower quadrant, PERRL. Mild right nasolabial fold flattening. Tongue midline. LUE and LLE 5/5. RUE proximal 3-/5, finger grip 3-/5. RLE 4+/5 proximal and 5-/5 distally. Sensation symmetrical bilaterally, left FTN intact, gait not tested.   Etiology for patient stroke not quite clear, likely due to large vessel disease with left ICA siphon stenosis versus occult A. fib.  Recommend aspirin 325 and Plavix 75 DAPT for 3 months and then aspirin alone given large vessel disease.  Continue Lipitor 40.  Recommend loop recorder to rule out A. Fib, cardiology EP aware.  BP goal 130-150 given large vessel stenosis.  We will follow  For detailed assessment and plan, please refer to above as I have made changes wherever appropriate.   Rosalin Hawking, MD PhD Stroke Neurology 06/18/2021 5:17 PM    INTERVAL HISTORY No family at  bedside. Patient is lying on stretcher in NAD. 2D echo being performed. Patient denies nay new neurological events overnight. He is oriented to self and place. Unable to state year or DOB or age. Spoke to him about his results on his brain images and that a cerebral angiogram will need to be done today   Vitals:   06/18/21 1455 06/18/21 1510 06/18/21 1515 06/18/21 1520  BP: 128/70 117/68 116/72 124/70  Pulse: 64 70 69 65  Resp: 17 14 13 12   Temp:      TempSrc:      SpO2: 99% 100% 100% 100%  Weight:      Height:       CBC:  Recent Labs  Lab 06/17/21 1305 06/17/21 1316 06/18/21 0927  WBC 7.1  --  6.6  NEUTROABS 5.0  --   --   HGB 14.1 14.6 14.3  HCT 42.2 43.0 43.2  MCV 89.0  --  89.8  PLT 302  --  810   Basic Metabolic Panel:  Recent Labs  Lab 06/17/21 1530 06/18/21 0927  NA 137 138  K 3.9 4.3  CL 101 103  CO2 28 26  GLUCOSE 94 83  BUN 12 13  CREATININE 1.00 1.00  CALCIUM 8.9 8.9   Lipid Panel:  Recent Labs  Lab 06/18/21 0927  CHOL 192  TRIG 69  HDL 44  CHOLHDL 4.4  VLDL 14  LDLCALC 134*   HgbA1c:  Recent Labs  Lab 06/18/21 0927  HGBA1C 5.4   Urine Drug Screen: No results for input(s): LABOPIA, [place], LABBENZ, AMPHETMU, THCU, LABBARB  in the last 168 hours.  Alcohol Level  Recent Labs  Lab 06/17/21 1305  ETH <10    IMAGING past 24 hours ECHOCARDIOGRAM COMPLETE  Result Date: 06/18/2021    ECHOCARDIOGRAM REPORT   Patient Name:   KYLEY LAUREL Date of Exam: 06/18/2021 Medical Rec #:  [id number]     Height: Accession #:    [id number]    Weight: Date of Birth:  07/24/1951     BSA: Patient Age:    23 years      BP:           122/75 mmHg Patient Gender: M             HR:           68 bpm. Exam Location:  Inpatient Procedure: 2D Echo, Cardiac Doppler and Color Doppler Indications:   Stroke  History:       Patient has no prior history of Echocardiogram examinations.                Stroke.  Sonographer:   Glo Herring Referring      [place] Phys: IMPRESSIONS  1. Left ventricular ejection fraction, by estimation, is 50 to 55%. The left ventricle has low normal function. The left ventricle has no regional wall motion abnormalities. There is mild concentric left ventricular hypertrophy. Left ventricular diastolic parameters were normal.  2. Right ventricular systolic function is normal. The right ventricular size is normal. Tricuspid regurgitation signal is inadequate for assessing PA pressure.  3. Right atrial size was mildly dilated.  4. The mitral valve is normal in structure. Trivial mitral valve regurgitation. No evidence of mitral stenosis.  5. The aortic valve is normal in structure. Aortic valve regurgitation is not visualized. No aortic stenosis is present.  6. The inferior vena cava is normal in size with greater than 50% respiratory variability, suggesting right atrial pressure of 3 mmHg. Comparison(s): No prior Echocardiogram. FINDINGS  Left Ventricle: Left ventricular ejection fraction, by estimation, is 50 to 55%. The left ventricle has low normal function. The left ventricle has no regional wall motion abnormalities. The left ventricular internal cavity size was small. There is mild  concentric left ventricular hypertrophy. Left ventricular diastolic parameters were normal. Right Ventricle: The right ventricular size is normal. No increase in right ventricular wall thickness. Right ventricular systolic function is normal. Tricuspid regurgitation signal is inadequate for assessing PA pressure. Left Atrium: Left atrial size was normal in size. Right Atrium: Right atrial size was mildly dilated. Pericardium: There is no evidence of pericardial effusion. Presence of epicardial fat layer. Mitral Valve: The mitral valve is normal in structure. Trivial mitral valve regurgitation. No evidence of mitral valve stenosis. Tricuspid Valve: The tricuspid valve is normal in structure. Tricuspid valve regurgitation is not demonstrated. No evidence  of tricuspid stenosis. Aortic Valve: The aortic valve is normal in structure. Aortic valve regurgitation is not visualized. No aortic stenosis is present. Aortic valve mean gradient measures 4.0 mmHg. Aortic valve peak gradient measures 6.8 mmHg. Aortic valve area, by VTI measures 21.47 cm. Pulmonic Valve: The pulmonic valve was not well visualized. Pulmonic valve regurgitation is not visualized. No evidence of pulmonic stenosis. Aorta: The aortic root is normal in size and structure. Venous: The inferior vena cava is normal in size with greater than 50% respiratory variability, suggesting right atrial pressure of 3 mmHg. IAS/Shunts: No atrial level shunt detected by color flow Doppler.  LEFT VENTRICLE PLAX 2D LVIDd:  3.30 cm   Diastology LVIDs:         2.40 cm   LV e' medial:    5.22 cm/s LV PW:         1.30 cm   LV E/e' medial:  9.8 LV IVS:        1.30 cm   LV e' lateral:   7.83 cm/s LVOT diam:     2.00 cm   LV E/e' lateral: 6.6 LV SV:         5152 LVOT Area:     3.14 cm  RIGHT VENTRICLE RV Basal diam:  3.90 cm LEFT ATRIUM LA diam:      2.90 cm LA Vol (A2C): 44.4 ml LA Vol (A4C): 31.2 ml  AORTIC VALVE                     PULMONIC VALVE AV Area (Vmax):    2.46 cm      PV Vmax:       1.08 m/s AV Area (Vmean):   2.01 cm      PV Peak grad:  4.7 mmHg AV Area (VTI):     21.47 cm AV Vmax:           130.00 cm/s AV Vmean:          900.000 cm/s AV VTI:            2.400 m AV Peak Grad:      6.8 mmHg AV Mean Grad:      4.0 mmHg LVOT Vmax:         102.00 cm/s LVOT Vmean:        575.000 cm/s LVOT VTI:          16.400 m LVOT/AV VTI ratio: 6.83 MV E velocity: 51.40 cm/s MV A velocity: 5140.00 cm/s  SHUNTS MV E/A ratio:  0.01          Systemic VTI:  16.40 m                              Systemic Diam: 2.00 cm Kardie Tobb DO Electronically signed by Berniece Salines DO Signature Date/Time: 06/18/2021/2:51:34 PM    Final    VAS [place] LOWER EXTREMITY VENOUS (DVT)  Result Date: 06/18/2021  Lower Venous DVT Study Patient Name:   KHYRON GARNO  Date of Exam:   06/18/2021 Medical Rec #: [id number]      Accession #:    [id number] Date of Birth: November 10, 1950      Patient Gender: M Patient Age:   72 years Exam Location:  [facility] Procedure:      VAS [place] LOWER EXTREMITY VENOUS (DVT) Referring Phys: Cornelius Moras Calyn Sivils --------------------------------------------------------------------------------  Indications: Stroke.  Comparison Study: no prior Performing Technologist: Archie Patten RVS  Examination Guidelines: A complete evaluation includes B-mode imaging, spectral Doppler, color Doppler, and power Doppler as needed of all accessible portions of each vessel. Bilateral testing is considered an integral part of a complete examination. Limited examinations for reoccurring indications may be performed as noted. The reflux portion of the exam is performed with the patient in reverse Trendelenburg.  +---------+---------------+---------+-----------+----------+--------------+ RIGHT    CompressibilityPhasicitySpontaneityPropertiesThrombus Aging +---------+---------------+---------+-----------+----------+--------------+ CFV      Full           Yes      Yes                                 +---------+---------------+---------+-----------+----------+--------------+  SFJ      Full                                                        +---------+---------------+---------+-----------+----------+--------------+ FV Prox  Full                                                        +---------+---------------+---------+-----------+----------+--------------+ FV Mid   Full                                                        +---------+---------------+---------+-----------+----------+--------------+ FV DistalFull                                                        +---------+---------------+---------+-----------+----------+--------------+ PFV      Full                                                         +---------+---------------+---------+-----------+----------+--------------+ POP      Full           Yes      Yes                                 +---------+---------------+---------+-----------+----------+--------------+ PTV      Full                                                        +---------+---------------+---------+-----------+----------+--------------+ PERO     Full                                                        +---------+---------------+---------+-----------+----------+--------------+   +---------+---------------+---------+-----------+----------+--------------+ LEFT     CompressibilityPhasicitySpontaneityPropertiesThrombus Aging +---------+---------------+---------+-----------+----------+--------------+ CFV      Full           Yes      Yes                                 +---------+---------------+---------+-----------+----------+--------------+ SFJ      Full                                                        +---------+---------------+---------+-----------+----------+--------------+  FV Prox  Full                                                        +---------+---------------+---------+-----------+----------+--------------+ FV Mid   Full                                                        +---------+---------------+---------+-----------+----------+--------------+ FV DistalFull                                                        +---------+---------------+---------+-----------+----------+--------------+ PFV      Full                                                        +---------+---------------+---------+-----------+----------+--------------+ POP      Full           Yes      Yes                                 +---------+---------------+---------+-----------+----------+--------------+ PTV      Full                                                         +---------+---------------+---------+-----------+----------+--------------+ PERO     Full                                                        +---------+---------------+---------+-----------+----------+--------------+     Summary: BILATERAL: - No evidence of deep vein thrombosis seen in the lower extremities, bilaterally. -No evidence of popliteal cyst, bilaterally.   *See table(s) above for measurements and observations. Electronically signed by Monica Martinez MD on 06/18/2021 at 12:02:45 PM.    Final     PHYSICAL EXAM  Temp:  [97.9 F (36.6 C)-98.5 F (36.9 C)] 97.9 F (36.6 C) (11/14 1000) Pulse Rate:  [53-78] 65 (11/14 1520) Resp:  [9-19] 12 (11/14 1520) BP: (95-163)/(38-96) 124/70 (11/14 1520) SpO2:  [92 %-100 %] 100 % (11/14 1520)  General - Well nourished, well developed, in no apparent distress.  Ophthalmologic - fundi not visualized due to noncooperation.  Cardiovascular - Regular rhythm and rate.  Mental Status -  Level of arousal and orientation to self and place.  Language including expression, naming, repetition, intact. Attention span and concentration were normal.   Cranial Nerves II - XII - II - Visual field intact OU. III, IV, VI - Extraocular movements intact. V -  Facial sensation intact bilaterally. VII -slight right droop VIII - Hearing & vestibular intact bilaterally. X - Palate elevates symmetrically. XI - Chin turning & shoulder shrug intact bilaterally. XII - Tongue protrusion intact.  Motor Strength - right arm with slight movement, not able to lift off bed.Right leg with slight drift.  Bulk was normal and fasciculations were absent.   Motor Tone - Muscle tone was assessed at the neck and appendages and was normal.  Sensory - Light touch, temperature/pinprick were assessed and were symmetrical, but he extinguishes to double simultaneous stimuli  Coordination - The patient had normal movements in the hands and feet with no ataxia or  dysmetria.  Tremor was absent.  Gait and Station - deferred.   ASSESSMENT/PLAN Mr. MARQUAY KRUSE is a 70 y.o. male with history of right-handed male with past medical history significant for colon cancer, cocaine abuse, no chronic home medications and no recent follow-up with medical care, presents to the ED for right upper extremity weakness and confusion and aphasia   Stroke:  Acute left MCA infarct embolic secondary to small vessel disease source Code Stroke CT head  -Small age-indeterminate left MCA territory cortically-based infarcts within the left frontal and parietal lobes. Additional 11 mm focal hypodensity within the left parietooccipital lobe subcortical white matter, which may reflect an age-indeterminate white matter infarct. ASPECTS is 8. No evidence of acute intracranial hemorrhage. -Background mild chronic small vessel ischemic changes within the cerebral white matter. -Indeterminate circumscribed lucent lesions within the left parietal calvarium and superior midline occipital calvarium, measuring up to 15 mm. Short-interval 40-month non-contrast head CT follow-up recommended to ensure size stability.  CTA head  1. No intracranial large vessel occlusion. 2. Intracranial atherosclerotic disease with multifocal stenoses, most notably as follows. 3. Severe stenosis within the distal cavernous/paraclinoid left ICA. 4. Severe stenosis within a proximal-to-mid M2 left MCA vessel. 5. Up to moderate stenosis within the distal cavernous/paraclinoid right ICA.  CTA neck:   1. The common carotid and internal carotid arteries are patent within the neck without stenosis. Minimal calcified plaque within the carotid bifurcations and proximal ICAs, bilaterally. 2. Streak and beam hardening artifact arising from a dense left-sided contrast bolus partially obscures the V1 left vertebral artery. Within this limitation, the vertebral arteries are patent within the neck without appreciable  stenosis. CT perfusion  The perfusion software identifies no core infarct. The perfusion software identifies no critically hypoperfused parenchyma utilizing the Tmax>6 seconds threshold. However, there is a 5 mL region of hypoperfusion within the left frontoparietal lobes when utilizing the T-max greater than 4 seconds threshold. The perfusion software reports no mismatch volume.   Cerebral angio pending  MRI   1. Multiple cortical and subcortical infarcts throughout the left frontal and parietal lobes, suggestive of watershed territory infarcts (both cortical and internal border zones). Mild associated edema without significant mass effect. 2. Moderate chronic microvascular ischemic disease. 3. Calvarial lesions described on same day CT head are intrinsically T1 hyperintense, probably benign. The previously recommended 3 month CT head follow up could confirm stability.  2D Echo EF 50-55%. No shunt Bilateral LE venous doppler  - No evidence of deep vein thrombosis seen in the lower extremities, bilaterally.  -No evidence of popliteal cyst, bilaterally LDL 134 HgbA1c 5.4 VTE prophylaxis - Lovenox/ SCD's    Diet   Diet NPO time specified Except for: Sips with Meds   No antithrombotic prior to admission, now on aspirin 325 mg daily and clopidogrel 75 mg daily.  Therapy recommendations:  pending Disposition:  pending  Hypertension Home meds:  none Stable Permissive hypertension (OK if < 220/120) but gradually normalize in 5-7 days Long-term BP goal normotensive  Hyperlipidemia Home meds:  none LDL 134, goal < 70 Add atorvastatin 40mg   Continue statin at discharge   Other Stroke Risk Factors Advanced Age >/= 2  Cigarette smoker advised to stop smoking Substance abuse - UDS:  THC POSITIVE, Cocaine POSITIVE. Patient advised to stop using due to stroke risk.  Hospital day # 1  Beulah Gandy, NP  To contact Stroke Continuity provider, please refer to [email or web address] After hours,  contact General Neurology

## 2021-06-18 NOTE — Progress Notes (Deleted)
PT Evaluation Note   06/18/21 1139  PT Visit Information  Last PT Received On 06/18/21  Assistance Needed +1  History of Present Illness Pt is a 70 y/o male admitted secondary to RUE weakness, nausea/vomiting, and confusion. 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 Pt to IR for diagnostic cerebral angiogram secondary to Stenosis of vessels per CTA. PMH includes colon cancer.  Precautions  Precautions Fall  Restrictions  Weight Bearing Restrictions No  Home Living  Family/patient expects to be discharged to: Private residence  Living Arrangements Spouse/significant other;Other (Comment) (multiple grandchildren and great grandchildren)  Available Help at Discharge Family  Type of [place] Access Level entry  Home Layout Two level  Alternate Level Stairs-Number of Steps flight  Alternate Level Stairs-Rails Right  Bathroom Shower/Tub Tub/shower unit  Research officer, [employer] - single point  Prior Function  Prior Level of Function  Independent/Modified Independent  Mobility Comments Likes to play golf  Communication  Communication No difficulties  Pain Assessment  Pain Assessment No/denies pain  Cognition  Arousal/Alertness Awake/alert  Behavior During Therapy Impulsive  Overall Cognitive Status No family/caregiver present to determine baseline cognitive functioning  General Comments Pt impulsive and with decreased safety awareness. Found standing in room with wires tangled and urine all over the floor.  Upper Extremity Assessment  Upper Extremity Assessment Defer to OT evaluation (significant RUE weakness.)  Lower Extremity Assessment  Lower Extremity Assessment Generalized weakness  Cervical / Trunk Assessment  Cervical / Trunk Assessment Normal  Bed Mobility  Overal bed mobility Needs Assistance  Bed Mobility Sit to Supine  Sit to supine Supervision  General bed mobility comments Found standing in room upon entry. Supervision  for safety and line management for return to supine.  Transfers  Overall transfer level Needs assistance  Equipment used None  Transfers Sit to/from Stand  Sit to Stand Min guard  General transfer comment Min guard for safety to stand from chair following clean up  Ambulation/Gait  Ambulation/Gait assistance Min guard  Gait Distance (Feet) 10 Feet  Assistive device None  Gait Pattern/deviations Step-through pattern;Decreased stride length  General Gait Details ambulated short distance in room from bed to chair and back to bed in ED. Decreased safety awareness as pt found standing in urine upon entry and walked through it instead of around  Gait velocity Decreased  Balance  Overall balance assessment Needs assistance  Sitting-balance support No upper extremity supported;Feet supported  Sitting balance-Leahy Scale Good  Standing balance support No upper extremity supported;During functional activity  Standing balance-Leahy Scale Fair  PT - End of Session  Equipment Utilized During Treatment Gait belt  Activity Tolerance Patient tolerated treatment well  Patient left in bed;with call bell/phone within reach (on stretcher in ED)  Nurse Communication Mobility status  PT Assessment  PT Recommendation/Assessment Patient needs continued PT services  PT Visit Diagnosis Other abnormalities of gait and mobility (R26.89);Other symptoms and signs involving the nervous system (R29.898);Hemiplegia and hemiparesis  Hemiplegia - Right/Left Right  Hemiplegia - caused by Cerebral infarction  PT Problem List Decreased strength;Decreased balance;Decreased mobility;Decreased cognition;Decreased knowledge of use of DME;Decreased safety awareness;Decreased knowledge of precautions  PT Plan  PT Frequency (ACUTE ONLY) Min 4X/week  PT Treatment/Interventions (ACUTE ONLY) DME instruction;Stair training;Gait training;Functional mobility training;Therapeutic exercise;Therapeutic activities;Balance  training;Patient/family education;Cognitive remediation  AM-PAC PT "6 Clicks" Mobility Outcome Measure (Version 2)  Help needed turning from your back to your side while in a flat bed without  using bedrails? 4  Help needed moving from lying on your back to sitting on the side of a flat bed without using bedrails? 4  Help needed moving to and from a bed to a chair (including a wheelchair)? 3  Help needed standing up from a chair using your arms (e.g., wheelchair or bedside chair)? 3  Help needed to walk in hospital room? 3  Help needed climbing 3-5 steps with a railing?  3  6 Click Score 20  Consider Recommendation of Discharge To: Home with no services  Progressive Mobility  What is the highest level of mobility based on the progressive mobility assessment? Level 5 (Walks with assist in room/hall) - Balance while stepping forward/back and can walk in room with assist - Complete  Mobility Ambulated with assistance in room  PT Recommendation  Follow Up Recommendations Outpatient PT  Assistance recommended at discharge Frequent or constant Supervision/Assistance  Functional Status Assessment Patient has had a recent decline in their functional status and demonstrates the ability to make significant improvements in function in a reasonable and predictable amount of time.  PT equipment None recommended by PT  Individuals Consulted  Consulted and Agree with Results and Recommendations Patient  Acute Rehab PT Goals  Patient Stated Goal to go home  PT Goal Formulation With patient  Time For Goal Achievement 07/02/21  Potential to Achieve Goals Good  PT Time Calculation  PT Start Time (ACUTE ONLY) 1123  PT Stop Time (ACUTE ONLY) 1146  PT Time Calculation (min) (ACUTE ONLY) 23 min  PT General Charges  $$ ACUTE PT VISIT 1 Visit  PT Evaluation  $PT Eval Moderate Complexity 1 Mod   Pt admitted secondary to problem above with deficits above. Pt with decreased safety awareness and found standing  with puddle of urine in floor. Noted significant RUE weakness throughout. Min guard for ambulation in the room. Feel pt would benefit from outpatient neuro PT at d/c to address current deficits. Will continue to follow acutely.   Reuel Derby, PT, DPT  Acute Rehabilitation Services  Pager: [phone] Office: [phone]

## 2021-06-18 NOTE — Progress Notes (Signed)
Lower extremity venous has been completed.   Preliminary results in CV Proc.   Philip Brooks 06/18/2021 10:09 AM

## 2021-06-18 NOTE — Progress Notes (Signed)
OT Cancellation Note  Patient Details Name: Philip Brooks MRN: [id number] DOB: 1951/07/16   Cancelled Treatment:    Reason Eval/Treat Not Completed: Patient at procedure or test/ unavailable. Pt is currently in IR.  [name], OTR/L Acute Rehab Services Pager [phone] Office [phone]    [name] 06/18/2021, 2:32 PM

## 2021-06-18 NOTE — Progress Notes (Signed)
SLP Cancellation Note  Patient Details Name: [name] MRN: [id number] DOB: 31-May-1951   Cancelled treatment:       Reason Eval/Treat Not Completed: Other (comment). Pt passed the [place] and has tolerated meals. Is now NPO for potential angiogram today. Will d/c swallow eval orders.    Deniah Saia, Katherene Ponto 06/18/2021, 9:06 AM

## 2021-06-18 NOTE — ED Notes (Signed)
IR to bring pt to 5W

## 2021-06-18 NOTE — ED Notes (Signed)
Pt is sleeping in NAD.

## 2021-06-18 NOTE — Progress Notes (Signed)
Pt arrived to floor around 1600 from IR by RN. Pt transferred from stretcher to hospital by nurses. Pt is not to move his right leg or apply pressure to that area until after bedrest period. Pt alert and oriented x4. VSS. Respirations even and unlabored on room air. Pt unable to move his right arm. [place] completed. Assessment completed. Skin checked with Junie Panning RN. Call bell within reach. Bed in low position.

## 2021-06-18 NOTE — Consult Note (Signed)
Chief Complaint: Patient was seen in consultation today for  Chief Complaint  Patient presents with   Code Stroke   Supervising Physician: Pedro Earls  Patient Status: [facility] - ED  History of Present Illness: Philip Brooks is a 70 y.o. male with symptoms of stroke.  Per IM:  "Last night around 930PM patient had an episode of vomiting and confusion. Patient was "looking for the television" however, television that was right in front of him. Patient went outside to look for the television. He was redirected by his wife and daughter to come back into the house. Wife states, he "laughed to off" and got ready for bed. Afterwards, patient went to bed without any further complaints. According to wife, patient slept through the night without any issues. Patient states that he was well until 8AM this morning when he woke up, wife noticed he was stumbling in the kitchen. Patient states he experienced right arm weakness and slurred speech. Patient states he was unable to lift his right arm entirely. Wife called EMS, when they arrived, patient was sitting in the chair and required assisted transfer into the gurney.    Wife states that 1 year ago, patient experienced episode of starring of into the distance and his eyes were fixed in an upward gaze. This episode lasted a few minutes and patient would come to and not recall the incident. This episode occurred twice with each episode patient would come to and not recall the event. Patient did not seek medical attention at that time."  Past Medical History:  Diagnosis Date   Colon cancer ([place]) 2007   S/P chemo/radiation/bowel resection    Allergies: Lactose  Medications: Lipitor, ASA, Plavix    Family History  Problem Relation Age of Onset   Diabetes Mother    Cancer Father     Social History   Socioeconomic History   Marital status: Single    Spouse name: Not on file   Number of children: Not on file   Years of  education: Not on file   Highest education level: Not on file  Occupational History   Not on file  Tobacco Use   Smoking status: Every Day    Packs/day: 1.50    Years: 43.00    Pack years: 64.50    Types: Cigarettes   Smokeless tobacco: Not on file  Substance and Sexual Activity   Alcohol use: Not on file   Drug use: Not Currently   Sexual activity: Not on file  Other Topics Concern   Not on file  Social History Narrative   Not on file   Social Determinants of Health   Financial Resource Strain: Not on file  Food Insecurity: Not on file  Transportation Needs: Not on file  Physical Activity: Not on file  Stress: Not on file  Social Connections: Not on file    Review of Systems: A 12 point ROS discussed and pertinent positives are indicated in the HPI above.  All other systems are negative.  Review of Systems  Constitutional:  Positive for fatigue.  HENT:  Positive for dental problem and voice change.   Respiratory: Negative.    Cardiovascular: Negative.   Gastrointestinal: Negative.   Endocrine: Negative.   Genitourinary: Negative.   Musculoskeletal:        Right arm "doesn't work"  Skin:  Positive for rash.  Neurological:  Positive for speech difficulty and weakness.  Hematological: Negative.   Psychiatric/Behavioral: Negative.  Vital Signs: BP (!) 141/69   Pulse 63   Temp 97.9 F (36.6 C) (Oral)   Resp 11   Ht 5\' 7"  (1.702 m)   Wt 168 lb (76.2 kg)   SpO2 97%   BMI 26.31 kg/m   Physical Exam Constitutional:      General: He is not in acute distress.    Appearance: He is not ill-appearing.  HENT:     Head: Normocephalic.     Nose: Nose normal.     Mouth/Throat:     Mouth: Mucous membranes are moist.     Dentition: Abnormal dentition.     Pharynx: Oropharynx is clear. Uvula midline.     Comments: Missing multiple upper teeth. Eyes:     Extraocular Movements: Extraocular movements intact.  Cardiovascular:     Rate and Rhythm: Normal rate and  regular rhythm.     Pulses: Normal pulses.  Pulmonary:     Effort: Pulmonary effort is normal.     Breath sounds: Normal breath sounds.  Abdominal:     General: Abdomen is flat.     Palpations: Abdomen is soft.  Musculoskeletal:     Comments: Attempts to raise right arm using shoulder muscles, but unsuccessful in movement  Skin:    General: Skin is warm and dry.  Neurological:     Mental Status: He is alert.     Motor: Weakness present.    Imaging: MR BRAIN W WO CONTRAST  Result Date: 06/17/2021 CLINICAL DATA:  Neuro deficit, acute, stroke suspected EXAM: MRI HEAD WITHOUT AND WITH CONTRAST TECHNIQUE: Multiplanar, multiecho pulse sequences of the brain and surrounding structures were obtained without and with intravenous contrast. CONTRAST:  7.23mL GADAVIST GADOBUTROL 1 MMOL/ML IV SOLN COMPARISON:  Same day CT head. FINDINGS: Brain: Multiple small acute infarcts throughout the left frontal and parietal cortex and white matter. Additional scattered moderate T2/FLAIR hyperintensities in the white matter, nonspecific but compatible with chronic microvascular ischemic disease. No evidence of acute hemorrhage, hydrocephalus, mass lesion, midline shift, or extra-axial fluid collection. No abnormal enhancement. Vascular: Better evaluated on same day CTA. Skull and upper cervical spine: Calvarial lesions described on same day CT head are intrinsically T1 hyperintense. Degenerative change in the visualized upper cervical spine. Sinuses/Orbits: Mild paranasal sinus mucosal thickening. Unremarkable orbits. Other: No mastoid effusions. IMPRESSION: 1. Multiple cortical and subcortical infarcts throughout the left frontal and parietal lobes, suggestive of watershed territory infarcts (both cortical and internal border zones). Mild associated edema without significant mass effect. 2. Moderate chronic microvascular ischemic disease. 3. Calvarial lesions described on same day CT head are intrinsically T1  hyperintense, probably benign. The previously recommended 3 month CT head follow up could confirm stability. Electronically Signed   By: Margaretha Sheffield M.D.   On: 06/17/2021 15:35   CT HEAD CODE STROKE WO CONTRAST  Addendum Date: 06/17/2021   ADDENDUM REPORT: 06/17/2021 13:42 ADDENDUM: These results were called by telephone at the time of interpretation on 06/17/2021 at 1:40 pm to provider Dr. Curly Shores, who verbally acknowledged these results. Electronically Signed   By: Kellie Simmering D.O.   On: 06/17/2021 13:42   Result Date: 06/17/2021 CLINICAL DATA:  Code stroke. Neuro deficit, acute, stroke suspected. EXAM: CT HEAD WITHOUT CONTRAST TECHNIQUE: Contiguous axial images were obtained from the base of the skull through the vertex without intravenous contrast. COMPARISON:  No pertinent prior exams available for comparison. FINDINGS: Brain: Cerebral volume is normal. Small age-indeterminate cortically based infarcts within the left frontal and parietal lobes (  for instance as seen on series 2, image 20) (series 2, image 23). Additionally, there is an 11 mm focal subcortical hypodensity within the left parietooccipital lobes which may reflect an age-indeterminate subcortical infarct (series 17, image 17) (series 5, image 56). Background mild patchy and ill-defined hypoattenuation within the cerebral white matter, nonspecific but compatible chronic small vessel ischemic disease. There is no acute intracranial hemorrhage. No extra-axial fluid collection. No evidence of an intracranial mass. No midline shift. Vascular: No hyperdense vessel.  Atherosclerotic calcifications. Skull: No calvarial fracture. Nonspecific circumscribed lucent lesions within the left parietal calvarium and superior occipital calvarium measuring up to 15 mm. Sinuses/Orbits: Visualized orbits show no acute finding. No significant paranasal sinus disease at the imaged levels. ASPECTS ([place] Stroke Program Early CT Score) - Ganglionic level  infarction (caudate, lentiform nuclei, internal capsule, insula, M1-M3 cortex): 7 - Supraganglionic infarction (M4-M6 cortex): 1 Total score (0-10 with 10 being normal): 8 IMPRESSION: Small age-indeterminate left MCA territory cortically-based infarcts within the left frontal and parietal lobes. Additional 11 mm focal hypodensity within the left parietooccipital lobe subcortical white matter, which may reflect an age-indeterminate white matter infarct. ASPECTS is 8. No evidence of acute intracranial hemorrhage. Background mild chronic small vessel ischemic changes within the cerebral white matter. Indeterminate circumscribed lucent lesions within the left parietal calvarium and superior midline occipital calvarium, measuring up to 15 mm. Short-interval 66-month non-contrast head CT follow-up recommended to ensure size stability. Electronically Signed: By: Kellie Simmering D.O. On: 06/17/2021 13:38   VAS [place] LOWER EXTREMITY VENOUS (DVT)  Result Date: 06/18/2021  Lower Venous DVT Study Patient Name:  KORDAE BUONOCORE  Date of Exam:   06/18/2021 Medical Rec #: [id number]      Accession #:    [id number] Date of Birth: 1951/02/23      Patient Gender: M Patient Age:   6 years Exam Location:  [facility] Procedure:      VAS [place] LOWER EXTREMITY VENOUS (DVT) Referring Phys: Cornelius Moras XU --------------------------------------------------------------------------------  Indications: Stroke.  Comparison Study: no prior Performing Technologist: Archie Patten RVS  Examination Guidelines: A complete evaluation includes B-mode imaging, spectral Doppler, color Doppler, and power Doppler as needed of all accessible portions of each vessel. Bilateral testing is considered an integral part of a complete examination. Limited examinations for reoccurring indications may be performed as noted. The reflux portion of the exam is performed with the patient in reverse Trendelenburg.   +---------+---------------+---------+-----------+----------+--------------+ RIGHT    CompressibilityPhasicitySpontaneityPropertiesThrombus Aging +---------+---------------+---------+-----------+----------+--------------+ CFV      Full           Yes      Yes                                 +---------+---------------+---------+-----------+----------+--------------+ SFJ      Full                                                        +---------+---------------+---------+-----------+----------+--------------+ FV Prox  Full                                                        +---------+---------------+---------+-----------+----------+--------------+  FV Mid   Full                                                        +---------+---------------+---------+-----------+----------+--------------+ FV DistalFull                                                        +---------+---------------+---------+-----------+----------+--------------+ PFV      Full                                                        +---------+---------------+---------+-----------+----------+--------------+ POP      Full           Yes      Yes                                 +---------+---------------+---------+-----------+----------+--------------+ PTV      Full                                                        +---------+---------------+---------+-----------+----------+--------------+ PERO     Full                                                        +---------+---------------+---------+-----------+----------+--------------+   +---------+---------------+---------+-----------+----------+--------------+ LEFT     CompressibilityPhasicitySpontaneityPropertiesThrombus Aging +---------+---------------+---------+-----------+----------+--------------+ CFV      Full           Yes      Yes                                  +---------+---------------+---------+-----------+----------+--------------+ SFJ      Full                                                        +---------+---------------+---------+-----------+----------+--------------+ FV Prox  Full                                                        +---------+---------------+---------+-----------+----------+--------------+ FV Mid   Full                                                        +---------+---------------+---------+-----------+----------+--------------+  FV DistalFull                                                        +---------+---------------+---------+-----------+----------+--------------+ PFV      Full                                                        +---------+---------------+---------+-----------+----------+--------------+ POP      Full           Yes      Yes                                 +---------+---------------+---------+-----------+----------+--------------+ PTV      Full                                                        +---------+---------------+---------+-----------+----------+--------------+ PERO     Full                                                        +---------+---------------+---------+-----------+----------+--------------+     Summary: BILATERAL: - No evidence of deep vein thrombosis seen in the lower extremities, bilaterally. -No evidence of popliteal cyst, bilaterally.   *See table(s) above for measurements and observations.    Preliminary    CT ANGIO HEAD NECK W WO CM W PERF (CODE STROKE)  Result Date: 06/17/2021 CLINICAL DATA:  Stroke code.  Right-sided deficits. EXAM: CT ANGIOGRAPHY HEAD AND NECK CT PERFUSION BRAIN TECHNIQUE: Multidetector CT imaging of the head and neck was performed using the standard protocol during bolus administration of intravenous contrast. Multiplanar CT image reconstructions and MIPs were obtained to evaluate the vascular anatomy.  Carotid stenosis measurements (when applicable) are obtained utilizing NASCET criteria, using the distal internal carotid diameter as the denominator. Multiphase CT imaging of the brain was performed following IV bolus contrast injection. Subsequent parametric perfusion maps were calculated using RAPID software. CONTRAST:  113mL OMNIPAQUE IOHEXOL 350 MG/ML SOLN COMPARISON:  Noncontrast head CT performed earlier today 06/17/2021. FINDINGS: CTA NECK FINDINGS Aortic arch: Common origin of the innominate and left common carotid arteries. Atherosclerotic plaque within the visualized aortic arch and proximal major branch vessels of the neck. Streak and beam hardening artifact arising from a dense left-sided contrast bolus partially obscures the left subclavian artery. Within this limitation, there is no appreciable hemodynamically significant stenosis within the innominate or proximal subclavian arteries. Right carotid system: CCA and ICA patent within the neck without stenosis. Minimal calcified plaque within the carotid bifurcation and proximal ICA. Left carotid system: CCA and ICA patent within the neck without stenosis. Minimal calcified plaque within the carotid bifurcation and proximal ICA. Vertebral arteries: The non dominant right vertebral artery is patent within the neck without stenosis. Minimal nonstenotic calcified plaque at the  origin of this vessel. The dominant left vertebral artery is patent within the neck. Streak and beam hardening artifact arising from a dense left-sided contrast bolus partially obscures the V1 left vertebral artery. Within this limitation, there is no appreciable hemodynamically significant stenosis within the left cervical vertebral artery. 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 Reversal of the expected cervical lordosis. Cervical spondylosis. No acute bony abnormality or aggressive osseous lesion. Partially imaged thoracic dextrocurvature. Other  neck: No neck mass or cervical lymphadenopathy. Upper chest: Centrilobular and paraseptal emphysema. No consolidation within the imaged lung apices. Review of the MIP images confirms the above findings CTA HEAD FINDINGS Anterior circulation: The intracranial internal carotid arteries are patent. Calcified plaque within both vessels. Up to moderate stenosis within the distal cavernous/paraclinoid right ICA. Severe stenosis within the distal cavernous/paraclinoid left ICA. The M1 middle cerebral arteries are patent. Severe focal stenosis within a proximal to mid left M2 MCA vessel (series 12, image 31) (series 7, image 96). No right M2 proximal branch occlusion or high-grade proximal stenosis is identified. The anterior cerebral arteries are patent. No intracranial aneurysm is identified. Posterior circulation: The intracranial vertebral arteries are patent. Nonstenotic calcified plaque within the proximal V4 left vertebral artery. The basilar artery is patent. The posterior cerebral arteries are patent. A right posterior communicating artery is present. Minimal atherosclerotic irregularity of the P2 left posterior cerebral artery. The left posterior communicating artery is hypoplastic or absent. Venous sinuses: Within the limitations of contrast timing, no convincing thrombus. Anatomic variants: As described. Review of the MIP images confirms the above findings CT Brain Perfusion Findings: CBF (<30%) Volume: 11mL Perfusion (Tmax>6.0s) volume: 49mL (however, there is a 5 mL region of hypoperfusion within the left frontoparietal lobes when using the Tmax greater than 4 seconds threshold). Mismatch Volume: 59mL Infarction Location:None identified No emergent large vessel occlusion. Severe stenosis within the distal cavernous/paraclinoid left ICA. Severe stenosis within a proximal to mid M2 left MCA vessel. These results were communicated to Dr. Andrey Farmer 2:06 pmon 11/13/2022by text page via the [facility] messaging system.  IMPRESSION: CTA neck: 1. The common carotid and internal carotid arteries are patent within the neck without stenosis. Minimal calcified plaque within the carotid bifurcations and proximal ICAs, bilaterally. 2. Streak and beam hardening artifact arising from a dense left-sided contrast bolus partially obscures the V1 left vertebral artery. Within this limitation, the vertebral arteries are patent within the neck without appreciable stenosis. CTA head: 1. No intracranial large vessel occlusion. 2. Intracranial atherosclerotic disease with multifocal stenoses, most notably as follows. 3. Severe stenosis within the distal cavernous/paraclinoid left ICA. 4. Severe stenosis within a proximal-to-mid M2 left MCA vessel. 5. Up to moderate stenosis within the distal cavernous/paraclinoid right ICA. CT perfusion head: The perfusion software identifies no core infarct. The perfusion software identifies no critically hypoperfused parenchyma utilizing the Tmax>6 seconds threshold. However, there is a 5 mL region of hypoperfusion within the left frontoparietal lobes when utilizing the T-max greater than 4 seconds threshold. The perfusion software reports no mismatch volume. Electronically Signed   By: Kellie Simmering D.O.   On: 06/17/2021 14:07    Labs:  CBC: Recent Labs    09/26/20 1630 06/17/21 1305 06/17/21 1316 06/18/21 0927  WBC 13.9* 7.1  --  6.6  HGB 13.5 14.1 14.6 14.3  HCT 41.9 42.2 43.0 43.2  PLT 224 302  --  229    COAGS: Recent Labs    06/17/21 1305  INR 1.0  APTT 27  BMP: Recent Labs    09/26/20 1630 06/17/21 1316 06/17/21 1530 06/18/21 0927  NA 137 139 137 138  K 4.0 6.8* 3.9 4.3  CL 101 101 101 103  CO2 28  --  28 26  GLUCOSE 148* 98 94 83  BUN 10 20 12 13   CALCIUM 8.8*  --  8.9 8.9  CREATININE 0.98 0.90 1.00 1.00  GFRNONAA >60  --  >60 >60    LIVER FUNCTION TESTS: Recent Labs    09/26/20 1630 06/17/21 1530 06/18/21 0927  BILITOT 0.3 0.7 0.9  AST 19 18 20   ALT 16 14  14   ALKPHOS 73 62 73  PROT 6.6 6.6 6.7  ALBUMIN 3.7 3.5 3.6     Assessment and Plan:  Stroke --Stenosis of vessels per CTA --Recommend proceeding with Diagnostic cerebral angiogram --Pt is NPO, is current on Plavix and ASA, and both pt and spouse agreeable to proceed  Risks and benefits of cerebral angiogram with intervention were discussed with the patient including, but not limited to bleeding, infection, vascular injury, contrast induced renal failure, stroke or even death.  This interventional procedure involves the use of X-rays and because of the nature of the planned procedure, it is possible that we will have prolonged use of X-ray fluoroscopy.  Potential radiation risks to you include (but are not limited to) the following: - A slightly elevated risk for cancer  several years later in life. This risk is typically less than 0.5% percent. This risk is low in comparison to the normal incidence of human cancer, which is 33% for women and 50% for men according to the [employer]. - Radiation induced injury can include skin redness, resembling a rash, tissue breakdown / ulcers and hair loss (which can be temporary or permanent).   The likelihood of either of these occurring depends on the difficulty of the procedure and whether you are sensitive to radiation due to previous procedures, disease, or genetic conditions.   IF your procedure requires a prolonged use of radiation, you will be notified and given written instructions for further action.  It is your responsibility to monitor the irradiated area for the 2 weeks following the procedure and to notify your physician if you are concerned that you have suffered a radiation induced injury.    All of the patient's questions were answered, patient is agreeable to proceed.  Consent signed and in chart.    Thank you for this interesting consult.  I greatly enjoyed meeting Philip Brooks and look forward to participating  in their care.    Electronically Signed: Pasty Spillers, PA 06/18/2021, 10:29 AM   I spent a total of 55 [place] in face to face in clinical consultation, greater than 50% of which was counseling/coordinating care for diagnostic cerebral angiogram.

## 2021-06-18 NOTE — ED Notes (Addendum)
Pt resting with no complaints at this time other than right arm weakness

## 2021-06-18 NOTE — Sedation Documentation (Signed)
5 fr Exoseal deployed at 1545.  Site is level 0 with dressing clean/dry/intact.  Pulses remain the same.

## 2021-06-18 NOTE — Progress Notes (Signed)
06/18/21 1139  PT Visit Information  Last PT Received On 06/18/21  Assistance Needed +1  History of Present Illness Pt is a 70 y/o male admitted secondary to RUE weakness, nausea/vomiting, and confusion. 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 Pt to IR for diagnostic cerebral angiogram secondary to Stenosis of vessels per CTA. PMH includes colon cancer.  Precautions  Precautions Fall  Restrictions  Weight Bearing Restrictions No  Home Living  Family/patient expects to be discharged to: Private residence  Living Arrangements Spouse/significant other;Other (Comment) (multiple grandchildren and great grandchildren)  Available Help at Discharge Family  Type of [place] Access Level entry  Home Layout Two level  Alternate Level Stairs-Number of Steps flight  Alternate Level Stairs-Rails Right  Bathroom Shower/Tub Tub/shower unit  Research officer, [employer] - single point  Prior Function  Prior Level of Function  Independent/Modified Independent  Mobility Comments Likes to play golf  Communication  Communication No difficulties  Pain Assessment  Pain Assessment No/denies pain  Cognition  Arousal/Alertness Awake/alert  Behavior During Therapy Impulsive  Overall Cognitive Status No family/caregiver present to determine baseline cognitive functioning  General Comments Pt impulsive and with decreased safety awareness. Found standing in room with wires tangled and urine all over the floor.  Upper Extremity Assessment  Upper Extremity Assessment Defer to OT evaluation (significant RUE weakness.)  Lower Extremity Assessment  Lower Extremity Assessment Generalized weakness  Cervical / Trunk Assessment  Cervical / Trunk Assessment Normal  Bed Mobility  Overal bed mobility Needs Assistance  Bed Mobility Sit to Supine  Sit to supine Supervision  General bed mobility comments Found standing in room upon entry. Supervision for safety and line  management for return to supine.  Transfers  Overall transfer level Needs assistance  Equipment used None  Transfers Sit to/from Stand  Sit to Stand Min guard  General transfer comment Min guard for safety to stand from chair following clean up  Ambulation/Gait  Ambulation/Gait assistance Min guard  Gait Distance (Feet) 10 Feet  Assistive device None  Gait Pattern/deviations Step-through pattern;Decreased stride length  General Gait Details ambulated short distance in room from bed to chair and back to bed in ED. Decreased safety awareness as pt found standing in urine upon entry and walked through it instead of around  Gait velocity Decreased  Balance  Overall balance assessment Needs assistance  Sitting-balance support No upper extremity supported;Feet supported  Sitting balance-Leahy Scale Good  Standing balance support No upper extremity supported;During functional activity  Standing balance-Leahy Scale Fair  PT - End of Session  Equipment Utilized During Treatment Gait belt  Activity Tolerance Patient tolerated treatment well  Patient left in bed;with call bell/phone within reach (on stretcher in ED)  Nurse Communication Mobility status  PT Assessment  PT Recommendation/Assessment Patient needs continued PT services  PT Visit Diagnosis Other abnormalities of gait and mobility (R26.89);Other symptoms and signs involving the nervous system (R29.898);Hemiplegia and hemiparesis  Hemiplegia - Right/Left Right  Hemiplegia - caused by Cerebral infarction  PT Problem List Decreased strength;Decreased balance;Decreased mobility;Decreased cognition;Decreased knowledge of use of DME;Decreased safety awareness;Decreased knowledge of precautions  PT Plan  PT Frequency (ACUTE ONLY) Min 4X/week  PT Treatment/Interventions (ACUTE ONLY) DME instruction;Stair training;Gait training;Functional mobility training;Therapeutic exercise;Therapeutic activities;Balance training;Patient/family  education;Cognitive remediation  AM-PAC PT "6 Clicks" Mobility Outcome Measure (Version 2)  Help needed turning from your back to your side while in a flat bed without using bedrails? 4  Help needed moving from lying on your back to sitting on the side of a flat bed without using bedrails? 4  Help needed moving to and from a bed to a chair (including a wheelchair)? 3  Help needed standing up from a chair using your arms (e.g., wheelchair or bedside chair)? 3  Help needed to walk in hospital room? 3  Help needed climbing 3-5 steps with a railing?  3  6 Click Score 20  Consider Recommendation of Discharge To: Home with no services  Progressive Mobility  What is the highest level of mobility based on the progressive mobility assessment? Level 5 (Walks with assist in room/hall) - Balance while stepping forward/back and can walk in room with assist - Complete  Mobility Ambulated with assistance in room  PT Recommendation  Follow Up Recommendations Outpatient PT  Assistance recommended at discharge Frequent or constant Supervision/Assistance  Functional Status Assessment Patient has had a recent decline in their functional status and demonstrates the ability to make significant improvements in function in a reasonable and predictable amount of time.  PT equipment None recommended by PT  Individuals Consulted  Consulted and Agree with Results and Recommendations Patient  Acute Rehab PT Goals  Patient Stated Goal to go home  PT Goal Formulation With patient  Time For Goal Achievement 07/02/21  Potential to Achieve Goals Good  PT Time Calculation  PT Start Time (ACUTE ONLY) 1123  PT Stop Time (ACUTE ONLY) 1146  PT Time Calculation (min) (ACUTE ONLY) 23 min  PT General Charges  $$ ACUTE PT VISIT 1 Visit  PT Evaluation  $PT Eval Moderate Complexity 1 Mod  PT Treatments  $Therapeutic Activity 8-22 mins   Pt admitted secondary to problem above with deficits above. Pt with decreased safety  awareness and found standing with puddle of urine in floor. Noted significant RUE weakness throughout. Min guard for ambulation in the room. Feel pt would benefit from outpatient neuro PT at d/c to address current deficits. Will continue to follow acutely.    Reuel Derby, PT, DPT  Acute Rehabilitation Services  Pager: [phone] Office: [phone]

## 2021-06-18 NOTE — Procedures (Addendum)
INTERVENTIONAL NEURORADIOLOGY BRIEF POSTPROCEDURE NOTE   DIAGNOSTIC CEREBRAL ANGIOGRAM    Attending: Dr. Erven Colla de Sindy Messing   Assistant: None.   Diagnosis: Intracranial left ICA stenosis.    Access site: Right common femoral artery.    Access closure: 5 [place] ExoSeal.    Anesthesia: Moderate sedation.    Medication used: 1 mg Versed IV; 25 mcg Fentanyl IV.   Complications: None.    Estimated blood loss: Negligible.    Specimen: None.    Findings: Atherosclerotic changes of the intracranial left ICA resulting in approximately 70% stenosis.  Detailed report to follow.    The patient tolerated the procedure well without incident or complication and is in stable condition.  Changes to this note were verbally communicated to Dr. Erlinda Hong  at 9:36 am on 06/19/21.

## 2021-06-18 NOTE — Sedation Documentation (Signed)
Handoff at bedside with 5W, RN.    Reviewed groin site, level 0 with dressing clean/dry/intact. Checked palpable pulse and reported that tibial pulse needs to be checked with doppler.  Reviewed orders about checking site/vitals/bed rest/keeping leg straight. Reminded RN that orders are in the signed and held section to be released.

## 2021-06-19 ENCOUNTER — Encounter (HOSPITAL_COMMUNITY)
Admission: EM | Disposition: A | Discharge status: Home / Self Care | Payer: Self-pay | Source: Home / Self Care | Attending: Internal Medicine

## 2021-06-19 DIAGNOSIS — F1410 Cocaine abuse, uncomplicated: Secondary | ICD-10-CM

## 2021-06-19 DIAGNOSIS — I6523 Occlusion and stenosis of bilateral carotid arteries: Secondary | ICD-10-CM

## 2021-06-19 DIAGNOSIS — I639 Cerebral infarction, unspecified: Secondary | ICD-10-CM | POA: Diagnosis not present

## 2021-06-19 HISTORY — PX: LOOP RECORDER INSERTION: EP1214

## 2021-06-19 LAB — TSH: TSH: 0.796 u[IU]/mL (ref 0.350–4.500)

## 2021-06-19 LAB — RAPID URINE DRUG SCREEN, HOSP PERFORMED
Amphetamines: NOT DETECTED
Barbiturates: NOT DETECTED
Benzodiazepines: NOT DETECTED
Cocaine: POSITIVE — AB
Opiates: NOT DETECTED
Tetrahydrocannabinol: NOT DETECTED

## 2021-06-19 LAB — HIV ANTIBODY (ROUTINE TESTING W REFLEX): HIV Screen 4th Generation wRfx: NONREACTIVE

## 2021-06-19 SURGERY — LOOP RECORDER INSERTION

## 2021-06-19 MED ORDER — CLOPIDOGREL BISULFATE 75 MG PO TABS: 75.0000 mg | ORAL_TABLET | Freq: Every day | ORAL | 0 refills | Status: AC

## 2021-06-19 MED ORDER — ASPIRIN 325 MG PO TBEC
325.0000 mg | DELAYED_RELEASE_TABLET | Freq: Every day | ORAL | 3 refills | Status: AC
Start: 2021-06-20 — End: ?

## 2021-06-19 MED ORDER — LIDOCAINE-EPINEPHRINE 1 %-1:100000 IJ SOLN
INTRAMUSCULAR | Status: AC
  Filled 2021-06-19: qty 1

## 2021-06-19 MED ORDER — LIDOCAINE-EPINEPHRINE 1 %-1:100000 IJ SOLN
INTRAMUSCULAR | Status: DC | PRN
  Administered 2021-06-19: 30 mL via INTRADERMAL

## 2021-06-19 MED ORDER — ATORVASTATIN CALCIUM 40 MG PO TABS
40.0000 mg | ORAL_TABLET | Freq: Every day | ORAL | 3 refills | Status: AC
Start: 2021-06-20 — End: ?

## 2021-06-19 SURGICAL SUPPLY — 2 items
PACK LOOP INSERTION (CUSTOM PROCEDURE TRAY) ×3 IMPLANT
SYSTEM MONITOR REVEAL LINQ II (Prosthesis & Implant Heart) ×2 IMPLANT

## 2021-06-19 NOTE — Interval H&P Note (Signed)
History and Physical Interval Note:  06/19/2021 4:04 PM  Philip Brooks  has presented today for surgery, with the diagnosis of stroke.  The various methods of treatment have been discussed with the patient and family. After consideration of risks, benefits and other options for treatment, the patient has consented to  Procedure(s): LOOP RECORDER INSERTION (N/A) as a surgical intervention.  The patient's history has been reviewed, patient examined, no change in status, stable for surgery.  I have reviewed the patient's chart and labs.  Questions were answered to the patient's satisfaction.     Thompson Grayer

## 2021-06-19 NOTE — Evaluation (Signed)
Occupational Therapy Evaluation Patient Details Name: Philip Brooks MRN: [id number] DOB: 1950-09-04 Today's Date: 06/19/2021   History of Present Illness Pt is a 70 y/o male admitted secondary to RUE weakness, nausea/vomiting, and confusion. 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 Pt to IR for diagnostic cerebral angiogram secondary to Stenosis of vessels per CTA. PMH includes colon cancer.   Clinical Impression   PTA pt lives independently with his wife and enjoys playing golf. Pt presents with significant decline in functional use of RUE in addition to deficits listed below. Recommend follow up with OT at the neuro outpt center to maximize functional level of independence with ADL and IADL tasks.  Pt inconsistently identified targets during visual field testing. Given questionable old L parietal occipital CVA, recommend Full Field Visual Assessment by his eye doctor. Due to below deficits recommend pt refrain from driving at this time. Discussed recommendations with wife who verbalized understanding. Reviewed signs/symptoms of CVA using BeFast.      Recommendations for follow up therapy are one component of a multi-disciplinary discharge planning process, led by the attending physician.  Recommendations may be updated based on patient status, additional functional criteria and insurance authorization.   Follow Up Recommendations  Outpatient OT (Neuro outpt; refrain from driving)    Assistance Recommended at Discharge Intermittent Supervision/Assistance  Functional Status Assessment  Patient has had a recent decline in their functional status and demonstrates the ability to make significant improvements in function in a reasonable and predictable amount of time.  Equipment Recommendations  None recommended by OT (wife plans to get a shower chair)    Recommendations for Other Services       Precautions / Restrictions Precautions Precautions: Fall      Mobility Bed  Mobility Overal bed mobility: Independent Bed Mobility: Sit to Supine       Sit to supine: Supervision   General bed mobility comments: reports he unhooked himself from wires and walked to bathroom earlier today.    Transfers Overall transfer level: Independent Equipment used: None Transfers: Sit to/from Stand Sit to Stand: Independent           General transfer comment: no imbalance; PT managed lines to monitor VS while ambulating      Balance Overall balance assessment: Mild deficits observed, not formally tested                                         ADL either performed or assessed with clinical judgement   ADL Overall ADL's : Needs assistance/impaired Eating/Feeding: Set up   Grooming: Minimal assistance   Upper Body Bathing: Minimal assistance   Lower Body Bathing: Min guard   Upper Body Dressing : Minimal assistance   Lower Body Dressing: Minimal assistance   Toilet Transfer: Modified Independent   Toileting- Clothing Manipulation and Hygiene: Supervision/safety       Functional mobility during ADLs: Modified independent General ADL Comments: Recommend use of shower chair to reduce risk of falls; wife verbalzied understanding     Vision Baseline Vision/History: 1 Wears glasses Patient Visual Report: No change from baseline Vision Assessment?: Yes Eye Alignment: Within Functional Limits Alignment/Gaze Preference: Within Defined Limits Tracking/Visual Pursuits: Decreased smoothness of horizontal tracking;Decreased smoothness of vertical tracking Saccades: Additional head turns occurred during testing Convergence: Within functional limits Visual Fields: Other (comment) (difficulty identifying all targets; will further assess)  Perception Perception Comments: appears intact   Praxis Praxis Praxis-Other Comments: decreased sensory motor processing; ? limb apraxia    Pertinent Vitals/Pain Pain Assessment: No/denies pain      Hand Dominance Right   Extremity/Trunk Assessment Upper Extremity Assessment Upper Extremity Assessment: RUE deficits/detail RUE Deficits / Details: Greater movement proximally; hikes shoulder to move armgross grasp/unable to actively extend digits; no digit opposition;unable to reach mouth with R hand; minimal extension against gravity with delay; not using functionally at this time RUE Sensation: decreased light touch;decreased proprioception RUE Coordination: decreased fine motor;decreased gross motor   Lower Extremity Assessment Lower Extremity Assessment: Defer to PT evaluation   Cervical / Trunk Assessment Cervical / Trunk Assessment: Normal   Communication Communication Communication: No difficulties   Cognition Arousal/Alertness: Awake/alert Behavior During Therapy: Impulsive Overall Cognitive Status: Impaired/Different from baseline Area of Impairment: Attention;Following commands;Safety/judgement;Awareness;Problem solving;Memory;Orientation                 Orientation Level: Time ("2023") Current Attention Level: Sustained Memory: Decreased short-term memory (recall 3/5 words with delayed recall) Following Commands: Follows one step commands consistently Safety/Judgement: Decreased awareness of safety;Decreased awareness of deficits (States his R arm is "OK"  however movement is severely impaired) Awareness: Emergent Problem Solving: Slow processing General Comments: impulsive     General Comments       Exercises     Shoulder Instructions      Home Living Family/patient expects to be discharged to:: Private residence Living Arrangements: Spouse/significant other Available Help at Discharge: Family Type of Home: House Home Access: Level entry     Home Layout: Two level Alternate Level Stairs-Number of Steps: flight Alternate Level Stairs-Rails: Right Bathroom Shower/Tub: Tub/shower unit;Walk-in shower   Bathroom Toilet: Standard Bathroom  Accessibility: Yes How Accessible: Accessible via walker Home Equipment: Cane - single point          Prior Functioning/Environment Prior Level of Function : Independent/Modified Independent             Mobility Comments: Likes to play golf;drives          OT Problem List: Decreased strength;Decreased range of motion;Decreased coordination;Decreased cognition;Decreased safety awareness;Decreased knowledge of use of DME or AE;Impaired tone;Impaired sensation;Impaired UE functional use      OT Treatment/Interventions: Self-care/ADL training;Therapeutic exercise;Neuromuscular education;DME and/or AE instruction;Therapeutic activities;Cognitive remediation/compensation;Patient/family education;Balance training    OT Goals(Current goals can be found in the care plan section) Acute Rehab OT Goals Patient Stated Goal: to get better OT Goal Formulation: With patient Time For Goal Achievement: 07/03/21 Potential to Achieve Goals: Good  OT Frequency: Min 2X/week   Barriers to D/C:            Co-evaluation              AM-PAC OT "6 Clicks" Daily Activity     Outcome Measure Help from another person eating meals?: A Little Help from another person taking care of personal grooming?: A Little Help from another person toileting, which includes using toliet, bedpan, or urinal?: A Little Help from another person bathing (including washing, rinsing, drying)?: A Little Help from another person to put on and taking off regular upper body clothing?: A Little Help from another person to put on and taking off regular lower body clothing?: A Little 6 Click Score: 18   End of Session Nurse Communication: Other (comment) (DC recommendations)  Activity Tolerance: Patient tolerated treatment well Patient left: in bed;with call bell/phone within reach;with family/visitor present  OT Visit Diagnosis: Muscle weakness (  generalized) (M62.81);Apraxia (R48.2);Other symptoms and signs  involving the nervous system (R29.898)                Time: 2182-8833 OT Time Calculation (min): 26 min Charges:  OT General Charges $OT Visit: 1 Visit OT Evaluation $OT Eval Moderate Complexity: 1 Mod OT Treatments $Therapeutic Activity: 8-22 mins  Maurie Boettcher, OT/L   Acute OT Clinical Specialist [place] Pager [phone] Office [phone]   [facility] 06/19/2021, 2:19 PM

## 2021-06-19 NOTE — Progress Notes (Signed)
EP following for potential loop pending course and disposition.   Please call with questions.   Legrand Como [address]" [place], PA-C  06/19/2021 10:08 AM

## 2021-06-19 NOTE — Progress Notes (Signed)
STROKE TEAM PROGRESS NOTE   INTERVAL HISTORY PT OT at the beside, pt working with PT/OT and recommend outpt PT/OT. Will have loop recorder placed before dc  Vitals:   06/18/21 2331 06/19/21 0323 06/19/21 0757 06/19/21 1202  BP: 103/71 133/73 116/67 115/66  Pulse: 62 68 67 60  Resp: 19 15 11 13   Temp: 97.8 F (36.6 C) 98 F (36.7 C) 97.8 F (36.6 C) 97.8 F (36.6 C)  TempSrc: Oral Oral Oral Oral  SpO2: 97% 100% 96% 96%  Weight:      Height:       CBC:  Recent Labs  Lab 06/17/21 1305 06/17/21 1316 06/18/21 0927  WBC 7.1  --  6.6  NEUTROABS 5.0  --   --   HGB 14.1 14.6 14.3  HCT 42.2 43.0 43.2  MCV 89.0  --  89.8  PLT 302  --  867   Basic Metabolic Panel:  Recent Labs  Lab 06/17/21 1530 06/18/21 0927  NA 137 138  K 3.9 4.3  CL 101 103  CO2 28 26  GLUCOSE 94 83  BUN 12 13  CREATININE 1.00 1.00  CALCIUM 8.9 8.9   Lipid Panel:  Recent Labs  Lab 06/18/21 0927  CHOL 192  TRIG 69  HDL 44  CHOLHDL 4.4  VLDL 14  LDLCALC 134*   HgbA1c:  Recent Labs  Lab 06/18/21 0927  HGBA1C 5.4   Urine Drug Screen:  Recent Labs  Lab 06/17/21 1705  LABOPIA NONE DETECTED  COCAINSCRNUR POSITIVE*  LABBENZ NONE DETECTED  AMPHETMU NONE DETECTED  THCU NONE DETECTED  LABBARB NONE DETECTED    Alcohol Level  Recent Labs  Lab 06/17/21 1305  ETH <10    IMAGING past 24 hours No results found.  PHYSICAL EXAM  Temp:  [97.4 F (36.3 C)-98 F (36.7 C)] 97.8 F (36.6 C) (11/15 1202) Pulse Rate:  [60-68] 60 (11/15 1202) Resp:  [11-19] 13 (11/15 1202) BP: (103-133)/(63-73) 115/66 (11/15 1202) SpO2:  [96 %-100 %] 96 % (11/15 1202) Weight:  [70.4 kg] 70.4 kg (11/14 1633)  General - Well nourished, well developed, in no apparent distress.  Ophthalmologic - fundi not visualized due to noncooperation.  Cardiovascular - Regular rhythm and rate.  Neuro - awake, alert, eyes open, orientated to age, place, month, but attempted twice for year. No aphasia, paucity of  speech, following all simple commands. Able to name and repeat. No gaze palsy, tracking bilaterally, visual field full but difficulty with right lower quadrant, PERRL. Mild right nasolabial fold flattening. Tongue midline. LUE and LLE 5/5. RUE proximal 3-/5, finger grip 3-/5. RLE 4+/5 proximal and 5-/5 distally. Sensation symmetrical bilaterally, left FTN intact, gait not tested.   ASSESSMENT/PLAN Mr. FILBERT CRAZE is a 70 y.o. 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 Cardioembolic source.  CT head showed left MCA cortical infarct, questionable old left parietal occipital small cortical infarct.   CTA head and neck bilateral ICA siphon stenosis left more than right.  Questionable left M2 stenosis.   CT perfusion negative.   MRI showed left MCA scattered infarcts.   Cerebral angiogram showed left ICA siphon 50-65% stenosis, left MCA patent.  Discussed with Dr.  Rodriguze, recommend maximal medical treatment as first line.  Discussed with EP, will place loop recorder to rule out afib. EF 50 to 55% LE venous Doppler no DVT.   A1c 5.4 LDL 134 UDS + cocaine VTE prophylaxis - Lovenox No antithrombotic prior to admission, now on aspirin 325 mg daily and clopidogrel 75 mg daily for 3 months and then ASA alone.   Therapy recommendations:  pending Disposition:  pending  ICA siphon stenosis CTA head and neck bilateral ICA siphon stenosis left more than right.  Cerebral angiogram showed left ICA siphon 50-65% stenosis Discussed with Dr. Theodoro Doing, recommend maximal medical treatment as first line.  aspirin 325 mg daily and clopidogrel 75 mg daily for 3 months and then ASA alone.     BP goal 130-150 given large vessel stenosis.  Hypertension Stable Avoid low BP  BP goal 130-150 given large vessel stenosis.  Hyperlipidemia Home meds:  none LDL 134, goal < 70 Add atorvastatin 40mg   Continue statin at discharge  Cocaine abuse UDS showed positive for cocaine Cessation education provided  Tobacco abuse Current smoker Smoking cessation counseling provided Pt is willing to quit   Other Stroke Risk Factors Advanced Age >/= 75   Hospital day # 2  Neurology will sign off. Please call with questions. Pt will follow up with stroke clinic NP at [facility] in about 4 weeks. Thanks for the consult.  Rosalin Hawking, MD PhD Stroke Neurology 06/19/2021 4:29 PM    To contact Stroke Continuity provider, please refer to [email or web address] After hours, contact General Neurology

## 2021-06-19 NOTE — Progress Notes (Signed)
Physical Therapy Treatment and Discharge Patient Details Name: Philip Brooks MRN: [id number] DOB: 06/13/1951 Today's Date: 06/19/2021   History of Present Illness Pt is a 70 y/o male admitted secondary to RUE weakness, nausea/vomiting, and confusion. 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 Pt to IR for diagnostic cerebral angiogram secondary to Stenosis of vessels per CTA. PMH includes colon cancer.    PT Comments    Patient remains impulsive, however balance is much improved and pt scored 53/56 on Berg Balance Assessment and 24/24 on Dynamic Gait Index. No further PT needs identified. PT is signing off.    Recommendations for follow up therapy are one component of a multi-disciplinary discharge planning process, led by the attending physician.  Recommendations may be updated based on patient status, additional functional criteria and insurance authorization.  Follow Up Recommendations  No PT follow up     Assistance Recommended at Discharge Set up Supervision/Assistance  Equipment Recommendations  None recommended by PT    Recommendations for Other Services       Precautions / Restrictions Precautions Precautions: Fall     Mobility  Bed Mobility Overal bed mobility: Independent             General bed mobility comments: reports he unhooked himself from wires and walked to bathroom earlier today.    Transfers Overall transfer level: Independent Equipment used: None Transfers: Sit to/from Stand Sit to Stand: Independent           General transfer comment: no imbalance; PT managed lines to monitor VS while ambulating    Ambulation/Gait Ambulation/Gait assistance: Independent Gait Distance (Feet): 300 Feet Assistive device: None Gait Pattern/deviations: WFL(Within Functional Limits)       General Gait Details: including navigating around obstacles on either side, through narrow spaces, head turns, 180 degree turns   Stairs              Wheelchair Mobility    Modified Rankin (Stroke Patients Only)       Balance Overall balance assessment: Independent Sitting-balance support: No upper extremity supported;Feet supported Sitting balance-Leahy Scale: Normal     Standing balance support: No upper extremity supported;During functional activity Standing balance-Leahy Scale: Normal                   Standardized Balance Assessment Standardized Balance Assessment : Berg Balance Test;Dynamic Gait Index Berg Balance Test Sit to Stand: Able to stand without using hands and stabilize independently Standing Unsupported: Able to stand safely 2 minutes Sitting with Back Unsupported but Feet Supported on Floor or Stool: Able to sit safely and securely 2 minutes Stand to Sit: Sits safely with minimal use of hands Transfers: Able to transfer safely, minor use of hands Standing Unsupported with Eyes Closed: Able to stand 10 seconds safely Standing Ubsupported with Feet Together: Able to place feet together independently and stand 1 minute safely From Standing, Reach Forward with Outstretched Arm: Can reach confidently >25 cm (10") From Standing Position, Pick up Object from Floor: Able to pick up shoe safely and easily From Standing Position, Turn to Look Behind Over each Shoulder: Looks behind from both sides and weight shifts well Turn 360 Degrees: Able to turn 360 degrees safely in 4 seconds or less Standing Unsupported, Alternately Place Feet on Step/Stool: Able to stand independently and safely and complete 8 steps in 20 seconds Standing Unsupported, One Foot in Front: Able to plae foot ahead of the other independently and hold 30 seconds Standing  on One Leg: Able to lift leg independently and hold equal to or more than 3 seconds Total Score: 53 Dynamic Gait Index Level Surface: Normal Change in Gait Speed: Normal Gait with Horizontal Head Turns: Normal Gait with Vertical Head Turns: Normal Gait and Pivot Turn:  Normal Step Over Obstacle: Normal Step Around Obstacles: Normal Steps: Normal Total Score: 24      Cognition Arousal/Alertness: Awake/alert Behavior During Therapy: Impulsive Overall Cognitive Status: Within Functional Limits for tasks assessed                                 General Comments: impulsively getting OOB prior to PT requesting (and even after PT requested he stop). No imbalance; oriented x 4        Exercises      General Comments        Pertinent Vitals/Pain Pain Assessment: No/denies pain    Home Living                          Prior Function            PT Goals (current goals can now be found in the care plan section) Acute Rehab PT Goals Patient Stated Goal: to go home Time For Goal Achievement: 07/02/21 Potential to Achieve Goals: Good Progress towards PT goals: Progressing toward goals    Frequency    Min 4X/week      PT Plan Discharge plan needs to be updated    Co-evaluation              AM-PAC PT "6 Clicks" Mobility   Outcome Measure  Help needed turning from your back to your side while in a flat bed without using bedrails?: None Help needed moving from lying on your back to sitting on the side of a flat bed without using bedrails?: None Help needed moving to and from a bed to a chair (including a wheelchair)?: None Help needed standing up from a chair using your arms (e.g., wheelchair or bedside chair)?: None Help needed to walk in hospital room?: None Help needed climbing 3-5 steps with a railing? : None 6 Click Score: 24    End of Session Equipment Utilized During Treatment: Gait belt Activity Tolerance: Patient tolerated treatment well Patient left: in bed;with call bell/phone within reach (RN also ok'd no bed alarm as pt moving independentlyh) Nurse Communication: Mobility status PT Visit Diagnosis: Other abnormalities of gait and mobility (R26.89);Other symptoms and signs involving the  nervous system (R29.898);Hemiplegia and hemiparesis Hemiplegia - Right/Left: Right Hemiplegia - caused by: Cerebral infarction     Time: 2751-7001 PT Time Calculation (min) (ACUTE ONLY): 12 min  Charges:  $Gait Training: 8-22 mins                      Arby Barrette, PT Acute Rehabilitation Services  Pager [phone] Office [phone]    Rexanne Mano 06/19/2021, 10:22 AM

## 2021-06-19 NOTE — Progress Notes (Signed)
Central tele called and stated pt's tele was reading ST elevation and V lead 0.89. Pt denied chest pain. ECG completed, did nt confirm ST elevation.   Paged teaching services and made aware. No new orders at this time.

## 2021-06-19 NOTE — Progress Notes (Addendum)
Pt back on floor. Handoff received.  Pt's [employer] device delivered and pt was instructed on use. Pt instructed to plug in device when he gets home close to his bed. Device placed with pt's belongings for when he's discharged.   Pt's wife Vanita Ingles also made aware and instructed on [employer] device.

## 2021-06-19 NOTE — Progress Notes (Incomplete)
° °  Subjective: ***  Objective:  Vital signs in last 24 hours: Vitals:   06/18/21 1927 06/18/21 2331 06/19/21 0323 06/19/21 0757  BP: 110/63 103/71 133/73 116/67  Pulse: 68 62 68 67  Resp: 12 19 15 11   Temp: 97.7 F (36.5 C) 97.8 F (36.6 C) 98 F (36.7 C) 97.8 F (36.6 C)  TempSrc: Oral Oral Oral Oral  SpO2: 96% 97% 100% 96%  Weight:      Height:       ***  Assessment/Plan:  Active Problems:   Acute ischemic stroke (HCC)  Acute ischemic stroke Diagnostic cerebral angiogram reveals  --IR angio results pending --HIV pending --TSH pending --UDS pending --On DAPT --atorvastatin 40 mg daily --BP permissive to 220/120 --Continue telemetry --PT/OT on board    Prior to Admission Living Arrangement: Anticipated Discharge Location: Barriers to Discharge: Dispo: Anticipated discharge in approximately 1-2 day(s).   Timothy Lasso, MD 06/19/2021, 10:46 AM Pager: [phone] After 5pm on weekdays and 1pm on weekends: On Call pager [phone]

## 2021-06-19 NOTE — Consult Note (Addendum)
ELECTROPHYSIOLOGY CONSULT NOTE  Patient ID: QUINTELL BONNIN MRN: [id number], DOB/AGE: October 10, 1950   Admit date: 06/17/2021 Date of Consult: 06/19/2021  Primary Physician: Merryl Hacker, No Primary Cardiologist: None  Primary Electrophysiologist: New to Dr. Rayann Heman Reason for Consultation: Cryptogenic stroke; recommendations regarding Implantable Loop Recorder Insurance: [facility] Medicare  History of Present Illness EP has been asked to evaluate Earley Abide for placement of an implantable loop recorder to monitor for atrial fibrillation by Dr Erlinda Hong.  The patient was admitted on 06/17/2021 with RUE weakness, confusion, and aphasia.    Imaging demonstrated acute left MCA infarct embolic secondary to small vessel disease source.    He has undergone workup for stroke including:  Code Stroke CT head -Small age-indeterminate left MCA territory cortically-based infarcts within the left frontal and parietal lobes. Additional 11 mm focal hypodensity within the left parietooccipital lobe subcortical white matter, which may reflect an age-indeterminate white matter infarct. ASPECTS is 8. No evidence of acute intracranial hemorrhage. Background mild chronic small vessel ischemic changes within the cerebral white matter. Indeterminate circumscribed lucent lesions within the left parietal calvarium and superior midline occipital calvarium, measuring up to 15 mm. Short-interval 43-monthnon-contrast head CT follow-up recommended to ensure size stability. CTA head - 1. No intracranial large vessel occlusion. 2. Intracranial atherosclerotic disease with multifocal stenoses, most notably as follows. 3. Severe stenosis within the distal cavernous/paraclinoid left ICA. 4. Severe stenosis within a proximal-to-mid M2 left MCA vessel. 5. Up to moderate stenosis within the distal cavernous/paraclinoid right ICA.  CTA neck: 1. The common carotid and internal carotid arteries are patent within the neck without stenosis. Minimal calcified  plaque within the carotid bifurcations and proximal ICAs, bilaterally. 2. Streak and beam hardening artifact arising from a dense left-sided contrast bolus partially obscures the V1 left vertebral artery. Within this limitation, the vertebral arteries are patent within the neck without appreciable stenosis. CT perfusion  The perfusion software identifies no core infarct. The perfusion software identifies no critically hypoperfused parenchyma utilizing the Tmax>6 seconds threshold. However, there is a 5 mL region of hypoperfusion within the left frontoparietal lobes when utilizing the T-max greater than 4 seconds threshold. The perfusion software reports no mismatch volume. Cerebral angio   1. 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 2. No evidence of occlusion or  hemodynamically significant stenosis of the left MCA territory. 3. Atherosclerotic changes of the right PCA with mild stenosis at the P2 segment. 4. Mild atherosclerotic disease in the bilateral carotid bifurcation without hemodynamically significant stenosis. MRI   1. Multiple cortical and subcortical infarcts throughout the left frontal and parietal lobes, suggestive of watershed territory infarcts (both cortical and internal border zones). Mild associated edema without significant mass effect. 2. Moderate chronic microvascular ischemic disease. 3. Calvarial lesions described on same day CT head are intrinsically T1 hyperintense, probably benign. The previously recommended 3 month CT head follow up could confirm stability.  2D Echo EF 50-55%. No shunt Bilateral LE venous doppler  - No evidence of deep vein thrombosis seen in the lower extremities, bilaterally.  -No evidence of popliteal cyst, bilaterally LDL 134 HgbA1c 5.4 VTE prophylaxis - Lovenox/ SCD's    The patient has been monitored on telemetry which has demonstrated sinus rhythm with no  arrhythmias.  Inpatient stroke work-up will not require a TEE per Neurology.   Echocardiogram as above. Lab work is reviewed.  Prior to admission, the patient denies chest pain, shortness of breath, dizziness,  palpitations, or syncope.  He is recovering from his stroke with plans for outpatient OT  at discharge.  Past Medical History:  Diagnosis Date   Colon cancer ([place]) 2007   S/P chemo/radiation/bowel resection     Surgical History:  Past Surgical History:  Procedure Laterality Date   IR ANGIO INTRA EXTRACRAN SEL INTERNAL CAROTID BILAT MOD SED  06/18/2021   IR ANGIO VERTEBRAL SEL VERTEBRAL BILAT MOD SED  06/18/2021   IR US GUIDE VASC ACCESS RIGHT  06/18/2021     No medications prior to admission.    Inpatient Medications:   aspirin EC  325 mg Oral Daily   atorvastatin  40 mg Oral Daily   clopidogrel  75 mg Oral Daily   enoxaparin (LOVENOX) injection  40 mg Subcutaneous Q24H    Allergies:  Allergies  Allergen Reactions   Lactose Other (See Comments)    unknown    Social History   Socioeconomic History   Marital status: Single    Spouse name: Not on file   Number of children: Not on file   Years of education: Not on file   Highest education level: Not on file  Occupational History   Not on file  Tobacco Use   Smoking status: Every Day    Packs/day: 1.50    Years: 43.00    Pack years: 64.50    Types: Cigarettes   Smokeless tobacco: Not on file  Substance and Sexual Activity   Alcohol use: Not on file   Drug use: Not Currently   Sexual activity: Not on file  Other Topics Concern   Not on file  Social History Narrative   Not on file   Social Determinants of Health   Financial Resource Strain: Not on file  Food Insecurity: Not on file  Transportation Needs: Not on file  Physical Activity: Not on file  Stress: Not on file  Social Connections: Not on file  Intimate Partner Violence: Not on file     Family History  Problem Relation Age of Onset    Diabetes Mother    Cancer Father       Review of Systems: All other systems reviewed and are otherwise negative except as noted above.  Physical Exam: Vitals:   06/18/21 1927 06/18/21 2331 06/19/21 0323 06/19/21 0757  BP: 110/63 103/71 133/73 116/67  Pulse: 68 62 68 67  Resp: _0 Temp: 97.7 F (36.5 C) 97.8 F (36.6 C) 98 F (36.7 C) 97.8 F (36.6 C)  TempSrc: Oral Oral Oral Oral  SpO2: 96% 97% 100% 96%  Weight:      Height:        GEN- The patient is well appearing, alert and oriented x 3 today.   Head- normocephalic, atraumatic Eyes-  Sclera clear, conjunctiva pink Ears- hearing intact Oropharynx- clear Neck- supple Lungs- Clear to ausculation bilaterally, normal work of breathing Heart- Regular rate and rhythm, no murmurs, rubs or gallops  GI- soft, NT, ND, + BS Extremities- no clubbing, cyanosis, or edema MS- no significant deformity or atrophy Skin- no rash or lesion Psych- euthymic mood, full affect + RUE deficit. Can only lift to about 3/4 shoulder height. Hand weakness   Labs:   Lab Results  Component Value Date   WBC 6.6 06/18/2021   HGB 14.3 06/18/2021   HCT 43.2 06/18/2021   MCV 89.8 06/18/2021   PLT 229 06/18/2021    Recent Labs  Lab 06/18/21 0927  NA 138  K 4.3  CL 103  CO2 26  BUN 13  CREATININE 1.00  CALCIUM 8.9  PROT 6.7  BILITOT 0.9  ALKPHOS 73  ALT 14  AST 20  GLUCOSE 83     Radiology/Studies: MR BRAIN W WO CONTRAST  Result Date: 06/17/2021 CLINICAL DATA:  Neuro deficit, acute, stroke suspected EXAM: MRI HEAD WITHOUT AND WITH CONTRAST TECHNIQUE: Multiplanar, multiecho pulse sequences of the brain and surrounding structures were obtained without and with intravenous contrast. CONTRAST:  7.18m GADAVIST GADOBUTROL 1 MMOL/ML IV SOLN COMPARISON:  Same day CT head. FINDINGS: Brain: Multiple small acute infarcts throughout the left frontal and parietal cortex and white matter. Additional scattered moderate T2/FLAIR  hyperintensities in the white matter, nonspecific but compatible with chronic microvascular ischemic disease. No evidence of acute hemorrhage, hydrocephalus, mass lesion, midline shift, or extra-axial fluid collection. No abnormal enhancement. Vascular: Better evaluated on same day CTA. Skull and upper cervical spine: Calvarial lesions described on same day CT head are intrinsically T1 hyperintense. Degenerative change in the visualized upper cervical spine. Sinuses/Orbits: Mild paranasal sinus mucosal thickening. Unremarkable orbits. Other: No mastoid effusions. IMPRESSION: 1. Multiple cortical and subcortical infarcts throughout the left frontal and parietal lobes, suggestive of watershed territory infarcts (both cortical and internal border zones). Mild associated edema without significant mass effect. 2. Moderate chronic microvascular ischemic disease. 3. Calvarial lesions described on same day CT head are intrinsically T1 hyperintense, probably benign. The previously recommended 3 month CT head follow up could confirm stability. Electronically Signed   By: FMargaretha SheffieldM.D.   On: 06/17/2021 15:35   IR [place] Vasc Access Right  Result Date: 06/19/2021 INDICATION: JRUPERT AZZARAis a 70year old male with past medical history significant for colon cancer 0 presented to emergency with altered mental status, right right-sided weakness and neglect. MRI of the brain was positive for left ACA/MCA watershed infarcts and CT angiogram showed high-grade stenosis of the intracranial left ICA. Comes to our service for a diagnostic cerebral angiogram to evaluate intracranial stenosis. EXAM: ULTRASOUND-GUIDED VASCULAR ACCESS DIAGNOSTIC CEREBRAL ANGIOGRAM COMPARISON:  CTA head and neck June 17, 2021. MEDICATIONS: No antibiotics but. ANESTHESIA/SEDATION: Moderate (conscious) sedation was employed during this procedure. A total of Versed 1 mg and Fentanyl 25 mcg was administered intravenously by the radiology  nurse. Total intra-service moderate Sedation Time: 44 minutes. The patient's level of consciousness and vital signs were monitored continuously by radiology nursing throughout the procedure under my direct supervision. CONTRAST:  65 mL of Omnipaque 300 milligram/mL FLUOROSCOPY TIME:  Fluoroscopy Time: 9 minutes 24 seconds (994 mGy). COMPLICATIONS: None immediate. TECHNIQUE: Informed written consent was obtained from the patient after a thorough discussion of the procedural risks, benefits and alternatives. All questions were addressed. Maximal Sterile Barrier Technique was utilized including caps, mask, sterile gowns, sterile gloves, sterile drape, hand hygiene and skin antiseptic. A timeout was performed prior to the initiation of the procedure. The right groin was prepped and draped in the usual sterile fashion. Using a micropuncture kit and the modified Seldinger technique, access was gained to the right common femoral artery and a 5 French sheath was placed. Real-time ultrasound guidance was utilized for vascular access including the acquisition of a permanent ultrasound image documenting patency of the accessed vessel. Under fluoroscopy, a 5 [place] 2 catheter and a 0.035" Terumo Glidewire into the aortic arch. The catheter was placed into the right common carotid artery. Frontal and lateral angiograms of the neck under biplane roadmap, the catheter was advanced into the  right internal carotid artery. Frontal, lateral, magnified right anterior oblique and magnified lateral views of the head were obtained. The catheter was then retracted and then advanced into the right subclavian artery. Under roadmap guidance, the catheter was advanced into the right vertebral artery. Townes and lateral views of the head were obtained. The catheter was then retracted and then advanced into the left subclavian artery. Under roadmap guidance, the catheter was advanced into the left vertebral artery. Townes and lateral  views of the head were obtained. The catheter was pulled back and then advanced into the left common carotid artery. Frontal and lateral angiograms of the neck were obtained. Under biplane roadmap, the catheter was advanced into the left internal carotid artery. Frontal, lateral, magnified bilateral oblique, magnified lateral and magnified waters views of the head were obtained. The catheter was subsequently withdrawn. A common femoral artery angiogram was obtained in right anterior oblique view via sheath side port. Then, a 5 [place] Exoseal was utilized for access closure. Adequate hemostasis achieved. FINDINGS: Right CCA angiograms: Cervical angiograms show mild atherosclerotic changes at the distal right carotid bulb without hemodynamically significant stenosis. Right ICA angiograms: There is brisk vascular contrast filling of the right ACA and MCA vascular trees. There is also brisk vascular contrast opacification of the right PCA vascular tree by prominent posterior communicating artery. Mild luminal irregularity along the cavernous and supraclinoid right ICA with mild, non flow-limiting stenosis of the ophthalmic segment. No aneurysms or abnormally high-flow, early draining veins are seen. No regions of abnormal hypervascularity are noted. The visualized dural sinuses are patent. Right vertebral artery angiograms: The non dominant right vertebral artery, basilar artery, and left posterior cerebral artery are unremarkable. Luminal irregularity with mild stenosis at the proximal P2 segment of the right posterior cerebral artery. No aneurysms or abnormally high-flow, early draining veins are seen. No regions of abnormal hypervascularity are noted. The visualized dural sinuses are patent. Left vertebral artery angiograms: The dominant left vertebral artery, basilar artery, and left posterior cerebral artery are unremarkable. Luminal irregularity with mild stenosis at the proximal P2 segment of the right posterior  cerebral artery. No aneurysms or abnormally high-flow, early draining veins are seen. No regions of abnormal hypervascularity are noted. The visualized dural sinuses are patent. Left CCA angiograms: Mild atherosclerotic changes of the distal left carotid bulb without hemodynamically significant stenosis. Left ICA angiograms: Luminal irregularity is seen from the petrous to terminus left ICA, more pronounced and at the ophthalmic/communicating segment where irregular plaque results in approximately 70% stenosis. There is brisk vascular contrast filling of the left ACA and MCA vascular trees without evidence of significant atherosclerotic disease, hemodynamically significant stenosis or occlusion. No aneurysms or abnormally high-flow, early draining veins are seen. No regions of abnormal hypervascularity are noted. The visualized dural sinuses are patent. Right common femoral artery angiogram and ultrasound: The access is at the level of the mid right common femoral artery. Mild atherosclerotic changes are seen in the distal right common femoral artery without hemodynamically significant stenosis. The right common femoral artery has adequate for vascular access and for closure device utilization. PROCEDURE: No intervention performed. IMPRESSION: 1. 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 2. No evidence of occlusion or hemodynamically significant stenosis of the left MCA territory. 3. Atherosclerotic changes of the right PCA with mild stenosis at the P2 segment. 4. Mild atherosclerotic disease in the bilateral carotid bifurcation without hemodynamically significant stenosis. PLAN: Case discussed with Dr. Lottie Rater.  Decision made to proceed with aggressive medical management. In case of treatment failure, patient will be considered for intracranial angioplasty and stenting. Electronically Signed   By: Pedro Earls  M.D.   On: 06/19/2021 10:30   ECHOCARDIOGRAM COMPLETE  Result Date: 06/18/2021    ECHOCARDIOGRAM REPORT   Patient Name:   CHAUNCE WINKELS Date of Exam: 06/18/2021 Medical Rec #:  [id number]     Height: Accession #:    [id number]    Weight: Date of Birth:  [birth year]     BSA: Patient Age:    70 years      BP:           122/75 mmHg Patient Gender: M             HR:           68 bpm. Exam Location:  Inpatient Procedure: 2D Echo, Cardiac Doppler and Color Doppler Indications:   Stroke  History:       Patient has no prior history of Echocardiogram examinations.                Stroke.  Sonographer:   Glo Herring Referring      [place] Phys: IMPRESSIONS  1. Left ventricular ejection fraction, by estimation, is 50 to 55%. The left ventricle has low normal function. The left ventricle has no regional wall motion abnormalities. There is mild concentric left ventricular hypertrophy. Left ventricular diastolic parameters were normal.  2. Right ventricular systolic function is normal. The right ventricular size is normal. Tricuspid regurgitation signal is inadequate for assessing PA pressure.  3. Right atrial size was mildly dilated.  4. The mitral valve is normal in structure. Trivial mitral valve regurgitation. No evidence of mitral stenosis.  5. The aortic valve is normal in structure. Aortic valve regurgitation is not visualized. No aortic stenosis is present.  6. The inferior vena cava is normal in size with greater than 50% respiratory variability, suggesting right atrial pressure of 3 mmHg. Comparison(s): No prior Echocardiogram. FINDINGS  Left Ventricle: Left ventricular ejection fraction, by estimation, is 50 to 55%. The left ventricle has low normal function. The left ventricle has no regional wall motion abnormalities. The left ventricular internal cavity size was small. There is mild  concentric left ventricular hypertrophy. Left ventricular diastolic parameters were normal. Right Ventricle: The  right ventricular size is normal. No increase in right ventricular wall thickness. Right ventricular systolic function is normal. Tricuspid regurgitation signal is inadequate for assessing PA pressure. Left Atrium: Left atrial size was normal in size. Right Atrium: Right atrial size was mildly dilated. Pericardium: There is no evidence of pericardial effusion. Presence of epicardial fat layer. Mitral Valve: The mitral valve is normal in structure. Trivial mitral valve regurgitation. No evidence of mitral valve stenosis. Tricuspid Valve: The tricuspid valve is normal in structure. Tricuspid valve regurgitation is not demonstrated. No evidence of tricuspid stenosis. Aortic Valve: The aortic valve is normal in structure. Aortic valve regurgitation is not visualized. No aortic stenosis is present. Aortic valve mean gradient measures 4.0 mmHg. Aortic valve peak gradient measures 6.8 mmHg. Aortic valve area, by VTI measures 21.47 cm. Pulmonic Valve: The pulmonic valve was not well visualized. Pulmonic valve regurgitation is not visualized. No evidence of pulmonic stenosis. Aorta: The aortic root is normal in size and structure. Venous: The inferior vena cava is normal in size with greater than 50% respiratory variability, suggesting right atrial pressure of 3 mmHg. IAS/Shunts: No atrial  level shunt detected by color flow Doppler.  LEFT VENTRICLE PLAX 2D LVIDd:         3.30 cm   Diastology LVIDs:         2.40 cm   LV e' medial:    5.22 cm/s LV PW:         1.30 cm   LV E/e' medial:  9.8 LV IVS:        1.30 cm   LV e' lateral:   7.83 cm/s LVOT diam:     2.00 cm   LV E/e' lateral: 6.6 LV SV:         5152 LVOT Area:     3.14 cm  RIGHT VENTRICLE RV Basal diam:  3.90 cm LEFT ATRIUM LA diam:      2.90 cm LA Vol (A2C): 44.4 ml LA Vol (A4C): 31.2 ml  AORTIC VALVE                     PULMONIC VALVE AV Area (Vmax):    2.46 cm      PV Vmax:       1.08 m/s AV Area (Vmean):   2.01 cm      PV Peak grad:  4.7 mmHg AV Area (VTI):      21.47 cm AV Vmax:           130.00 cm/s AV Vmean:          900.000 cm/s AV VTI:            2.400 m AV Peak Grad:      6.8 mmHg AV Mean Grad:      4.0 mmHg LVOT Vmax:         102.00 cm/s LVOT Vmean:        575.000 cm/s LVOT VTI:          16.400 m LVOT/AV VTI ratio: 6.83 MV E velocity: 51.40 cm/s MV A velocity: 5140.00 cm/s  SHUNTS MV E/A ratio:  0.01          Systemic VTI:  16.40 m                              Systemic Diam: 2.00 cm Kardie Tobb DO Electronically signed by Berniece Salines DO Signature Date/Time: 06/18/2021/2:51:34 PM    Final    CT HEAD CODE STROKE WO CONTRAST  Addendum Date: 06/17/2021   ADDENDUM REPORT: 06/17/2021 13:42 ADDENDUM: These results were called by telephone at the time of interpretation on 06/17/2021 at 1:40 pm to provider Dr. Curly Shores, who verbally acknowledged these results. Electronically Signed   By: Kellie Simmering D.O.   On: 06/17/2021 13:42   Result Date: 06/17/2021 CLINICAL DATA:  Code stroke. Neuro deficit, acute, stroke suspected. EXAM: CT HEAD WITHOUT CONTRAST TECHNIQUE: Contiguous axial images were obtained from the base of the skull through the vertex without intravenous contrast. COMPARISON:  No pertinent prior exams available for comparison. FINDINGS: Brain: Cerebral volume is normal. Small age-indeterminate cortically based infarcts within the left frontal and parietal lobes (for instance as seen on series 2, image 20) (series 2, image 23). Additionally, there is an 11 mm focal subcortical hypodensity within the left parietooccipital lobes which may reflect an age-indeterminate subcortical infarct (series 17, image 17) (series 5, image 56). Background mild patchy and ill-defined hypoattenuation within the cerebral white matter, nonspecific but compatible chronic small vessel ischemic disease. There is no acute intracranial hemorrhage. No  extra-axial fluid collection. No evidence of an intracranial mass. No midline shift. Vascular: No hyperdense vessel.  Atherosclerotic  calcifications. Skull: No calvarial fracture. Nonspecific circumscribed lucent lesions within the left parietal calvarium and superior occipital calvarium measuring up to 15 mm. Sinuses/Orbits: Visualized orbits show no acute finding. No significant paranasal sinus disease at the imaged levels. ASPECTS ([place] Stroke Program Early CT Score) - Ganglionic level infarction (caudate, lentiform nuclei, internal capsule, insula, M1-M3 cortex): 7 - Supraganglionic infarction (M4-M6 cortex): 1 Total score (0-10 with 10 being normal): 8 IMPRESSION: Small age-indeterminate left MCA territory cortically-based infarcts within the left frontal and parietal lobes. Additional 11 mm focal hypodensity within the left parietooccipital lobe subcortical white matter, which may reflect an age-indeterminate white matter infarct. ASPECTS is 8. No evidence of acute intracranial hemorrhage. Background mild chronic small vessel ischemic changes within the cerebral white matter. Indeterminate circumscribed lucent lesions within the left parietal calvarium and superior midline occipital calvarium, measuring up to 15 mm. Short-interval 72-monthnon-contrast head CT follow-up recommended to ensure size stability. Electronically Signed: By: KKellie SimmeringD.O. On: 06/17/2021 13:38   VAS [place]OWER EXTREMITY VENOUS (DVT)  Result Date: 06/18/2021  Lower Venous DVT Study Patient Name:  JMILO SCHREIER Date of Exam:   06/18/2021 Medical Rec #: [id number]     Accession #:    [id number] of Birth: [birth year]     Patient Gender: M Patient Age:   89years [facility]      VAS [place]OWER EXTREMITY VENOUS (DVT) Referring Phys: JCornelius MorasXU --------------------------------------------------------------------------------  Indications: Stroke.  Comparison Study: no prior Performing Technologist: MArchie PattenRVS  Examination Guidelines: A complete evaluation includes B-mode imaging, spectral Doppler, color Doppler, and  power Doppler as needed of all accessible portions of each vessel. Bilateral testing is considered an integral part of a complete examination. Limited examinations for reoccurring indications may be performed as noted. The reflux portion of the exam is performed with the patient in reverse Trendelenburg.  +---------+---------------+---------+-----------+----------+--------------+ RIGHT    CompressibilityPhasicitySpontaneityPropertiesThrombus Aging +---------+---------------+---------+-----------+----------+--------------+ CFV      Full           Yes      Yes                                 +---------+---------------+---------+-----------+----------+--------------+ SFJ      Full                                                        +---------+---------------+---------+-----------+----------+--------------+ FV Prox  Full                                                        +---------+---------------+---------+-----------+----------+--------------+ FV Mid   Full                                                        +---------+---------------+---------+-----------+----------+--------------+ FV  DistalFull                                                        +---------+---------------+---------+-----------+----------+--------------+ PFV      Full                                                        +---------+---------------+---------+-----------+----------+--------------+ POP      Full           Yes      Yes                                 +---------+---------------+---------+-----------+----------+--------------+ PTV      Full                                                        +---------+---------------+---------+-----------+----------+--------------+ PERO     Full                                                        +---------+---------------+---------+-----------+----------+--------------+    +---------+---------------+---------+-----------+----------+--------------+ LEFT     CompressibilityPhasicitySpontaneityPropertiesThrombus Aging +---------+---------------+---------+-----------+----------+--------------+ CFV      Full           Yes      Yes                                 +---------+---------------+---------+-----------+----------+--------------+ SFJ      Full                                                        +---------+---------------+---------+-----------+----------+--------------+ FV Prox  Full                                                        +---------+---------------+---------+-----------+----------+--------------+ FV Mid   Full                                                        +---------+---------------+---------+-----------+----------+--------------+ FV DistalFull                                                        +---------+---------------+---------+-----------+----------+--------------+  PFV      Full                                                        +---------+---------------+---------+-----------+----------+--------------+ POP      Full           Yes      Yes                                 +---------+---------------+---------+-----------+----------+--------------+ PTV      Full                                                        +---------+---------------+---------+-----------+----------+--------------+ PERO     Full                                                        +---------+---------------+---------+-----------+----------+--------------+     Summary: BILATERAL: - No evidence of deep vein thrombosis seen in the lower extremities, bilaterally. -No evidence of popliteal cyst, bilaterally.   *See table(s) above for measurements and observations. Electronically signed by Monica Martinez MD on 06/18/2021 at 12:02:45 PM.    Final    CT ANGIO HEAD NECK W WO CM W PERF (CODE  STROKE)  Result Date: 06/17/2021 CLINICAL DATA:  Stroke code.  Right-sided deficits. EXAM: CT ANGIOGRAPHY HEAD AND NECK CT PERFUSION BRAIN TECHNIQUE: Multidetector CT imaging of the head and neck was performed using the standard protocol during bolus administration of intravenous contrast. Multiplanar CT image reconstructions and MIPs were obtained to evaluate the vascular anatomy. Carotid stenosis measurements (when applicable) are obtained utilizing NASCET criteria, using the distal internal carotid diameter as the denominator. Multiphase CT imaging of the brain was performed following IV bolus contrast injection. Subsequent parametric perfusion maps were calculated using RAPID software. CONTRAST:  155m OMNIPAQUE IOHEXOL 350 MG/ML SOLN COMPARISON:  Noncontrast head CT performed earlier today 06/17/2021. FINDINGS: CTA NECK FINDINGS Aortic arch: Common origin of the innominate and left common carotid arteries. Atherosclerotic plaque within the visualized aortic arch and proximal major branch vessels of the neck. Streak and beam hardening artifact arising from a dense left-sided contrast bolus partially obscures the left subclavian artery. Within this limitation, there is no appreciable hemodynamically significant stenosis within the innominate or proximal subclavian arteries. Right carotid system: CCA and ICA patent within the neck without stenosis. Minimal calcified plaque within the carotid bifurcation and proximal ICA. Left carotid system: CCA and ICA patent within the neck without stenosis. Minimal calcified plaque within the carotid bifurcation and proximal ICA. Vertebral arteries: The non dominant right vertebral artery is patent within the neck without stenosis. Minimal nonstenotic calcified plaque at the origin of this vessel. The dominant left vertebral artery is patent within the neck. Streak and beam hardening artifact arising from a dense left-sided contrast bolus partially obscures the V1 left  vertebral artery. Within this limitation, there is no appreciable hemodynamically significant stenosis within the left  cervical vertebral artery. 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 Reversal of the expected cervical lordosis. Cervical spondylosis. No acute bony abnormality or aggressive osseous lesion. Partially imaged thoracic dextrocurvature. Other neck: No neck mass or cervical lymphadenopathy. Upper chest: Centrilobular and paraseptal emphysema. No consolidation within the imaged lung apices. Review of the MIP images confirms the above findings CTA HEAD FINDINGS Anterior circulation: The intracranial internal carotid arteries are patent. Calcified plaque within both vessels. Up to moderate stenosis within the distal cavernous/paraclinoid right ICA. Severe stenosis within the distal cavernous/paraclinoid left ICA. The M1 middle cerebral arteries are patent. Severe focal stenosis within a proximal to mid left M2 MCA vessel (series 12, image 31) (series 7, image 96). No right M2 proximal branch occlusion or high-grade proximal stenosis is identified. The anterior cerebral arteries are patent. No intracranial aneurysm is identified. Posterior circulation: The intracranial vertebral arteries are patent. Nonstenotic calcified plaque within the proximal V4 left vertebral artery. The basilar artery is patent. The posterior cerebral arteries are patent. A right posterior communicating artery is present. Minimal atherosclerotic irregularity of the P2 left posterior cerebral artery. The left posterior communicating artery is hypoplastic or absent. Venous sinuses: Within the limitations of contrast timing, no convincing thrombus. Anatomic variants: As described. Review of the MIP images confirms the above findings CT Brain Perfusion Findings: CBF (<30%) Volume: 34m Perfusion (Tmax>6.0s) volume: 037m(however, there is a 5 mL region of hypoperfusion within the left frontoparietal lobes  when using the Tmax greater than 4 seconds threshold). Mismatch Volume: 48m64mnfarction Location:None identified No emergent large vessel occlusion. Severe stenosis within the distal cavernous/paraclinoid left ICA. Severe stenosis within a proximal to mid M2 left MCA vessel. These results were communicated to Dr. BhaAndrey Farmer06 pmon 11/13/2022by text page via the [facility] system. IMPRESSION: CTA neck: 1. The common carotid and internal carotid arteries are patent within the neck without stenosis. Minimal calcified plaque within the carotid bifurcations and proximal ICAs, bilaterally. 2. Streak and beam hardening artifact arising from a dense left-sided contrast bolus partially obscures the V1 left vertebral artery. Within this limitation, the vertebral arteries are patent within the neck without appreciable stenosis. CTA head: 1. No intracranial large vessel occlusion. 2. Intracranial atherosclerotic disease with multifocal stenoses, most notably as follows. 3. Severe stenosis within the distal cavernous/paraclinoid left ICA. 4. Severe stenosis within a proximal-to-mid M2 left MCA vessel. 5. Up to moderate stenosis within the distal cavernous/paraclinoid right ICA. CT perfusion head: The perfusion software identifies no core infarct. The perfusion software identifies no critically hypoperfused parenchyma utilizing the Tmax>6 seconds threshold. However, there is a 5 mL region of hypoperfusion within the left frontoparietal lobes when utilizing the T-max greater than 4 seconds threshold. The perfusion software reports no mismatch volume. Electronically Signed   By: KylKellie SimmeringO.   On: 06/17/2021 14:07   IR ANGIO INTRA EXTRACRAN SEL INTERNAL CAROTID BILAT MOD SED  Result Date: 06/19/2021 INDICATION: JamKIONDRE GRENZ a 70 40ar old male with past medical history significant for colon cancer 0 presented to emergency with altered mental status, right right-sided weakness and neglect. MRI of the brain was  positive for left ACA/MCA watershed infarcts and CT angiogram showed high-grade stenosis of the intracranial left ICA. Comes to our service for a diagnostic cerebral angiogram to evaluate intracranial stenosis. EXAM: ULTRASOUND-GUIDED VASCULAR ACCESS DIAGNOSTIC CEREBRAL ANGIOGRAM COMPARISON:  CTA head and neck June 17, 2021. MEDICATIONS: No antibiotics but. ANESTHESIA/SEDATION: Moderate (conscious) sedation was employed during this procedure.  A total of Versed 1 mg and Fentanyl 25 mcg was administered intravenously by the radiology nurse. Total intra-service moderate Sedation Time: 44 minutes. The patient's level of consciousness and vital signs were monitored continuously by radiology nursing throughout the procedure under my direct supervision. CONTRAST:  65 mL of Omnipaque 300 milligram/mL FLUOROSCOPY TIME:  Fluoroscopy Time: 9 minutes 24 seconds (994 mGy). COMPLICATIONS: None immediate. TECHNIQUE: Informed written consent was obtained from the patient after a thorough discussion of the procedural risks, benefits and alternatives. All questions were addressed. Maximal Sterile Barrier Technique was utilized including caps, mask, sterile gowns, sterile gloves, sterile drape, hand hygiene and skin antiseptic. A timeout was performed prior to the initiation of the procedure. The right groin was prepped and draped in the usual sterile fashion. Using a micropuncture kit and the modified Seldinger technique, access was gained to the right common femoral artery and a 5 French sheath was placed. Real-time ultrasound guidance was utilized for vascular access including the acquisition of a permanent ultrasound image documenting patency of the accessed vessel. Under fluoroscopy, a 5 [place] Berenstein 2 catheter and a 0.035" Terumo Glidewire into the aortic arch. The catheter was placed into the right common carotid artery. Frontal and lateral angiograms of the neck under biplane roadmap, the catheter was advanced into  the right internal carotid artery. Frontal, lateral, magnified right anterior oblique and magnified lateral views of the head were obtained. The catheter was then retracted and then advanced into the right subclavian artery. Under roadmap guidance, the catheter was advanced into the right vertebral artery. Townes and lateral views of the head were obtained. The catheter was then retracted and then advanced into the left subclavian artery. Under roadmap guidance, the catheter was advanced into the left vertebral artery. Townes and lateral views of the head were obtained. The catheter was pulled back and then advanced into the left common carotid artery. Frontal and lateral angiograms of the neck were obtained. Under biplane roadmap, the catheter was advanced into the left internal carotid artery. Frontal, lateral, magnified bilateral oblique, magnified lateral and magnified waters views of the head were obtained. The catheter was subsequently withdrawn. A common femoral artery angiogram was obtained in right anterior oblique view via sheath side port. Then, a 5 [place] Exoseal was utilized for access closure. Adequate hemostasis achieved. FINDINGS: Right CCA angiograms: Cervical angiograms show mild atherosclerotic changes at the distal right carotid bulb without hemodynamically significant stenosis. Right ICA angiograms: There is brisk vascular contrast filling of the right ACA and MCA vascular trees. There is also brisk vascular contrast opacification of the right PCA vascular tree by prominent posterior communicating artery. Mild luminal irregularity along the cavernous and supraclinoid right ICA with mild, non flow-limiting stenosis of the ophthalmic segment. No aneurysms or abnormally high-flow, early draining veins are seen. No regions of abnormal hypervascularity are noted. The visualized dural sinuses are patent. Right vertebral artery angiograms: The non dominant right vertebral artery, basilar artery, and  left posterior cerebral artery are unremarkable. Luminal irregularity with mild stenosis at the proximal P2 segment of the right posterior cerebral artery. No aneurysms or abnormally high-flow, early draining veins are seen. No regions of abnormal hypervascularity are noted. The visualized dural sinuses are patent. Left vertebral artery angiograms: The dominant left vertebral artery, basilar artery, and left posterior cerebral artery are unremarkable. Luminal irregularity with mild stenosis at the proximal P2 segment of the right posterior cerebral artery. No aneurysms or abnormally high-flow, early draining veins are seen. No regions  of abnormal hypervascularity are noted. The visualized dural sinuses are patent. Left CCA angiograms: Mild atherosclerotic changes of the distal left carotid bulb without hemodynamically significant stenosis. Left ICA angiograms: Luminal irregularity is seen from the petrous to terminus left ICA, more pronounced and at the ophthalmic/communicating segment where irregular plaque results in approximately 70% stenosis. There is brisk vascular contrast filling of the left ACA and MCA vascular trees without evidence of significant atherosclerotic disease, hemodynamically significant stenosis or occlusion. No aneurysms or abnormally high-flow, early draining veins are seen. No regions of abnormal hypervascularity are noted. The visualized dural sinuses are patent. Right common femoral artery angiogram and ultrasound: The access is at the level of the mid right common femoral artery. Mild atherosclerotic changes are seen in the distal right common femoral artery without hemodynamically significant stenosis. The right common femoral artery has adequate for vascular access and for closure device utilization. PROCEDURE: No intervention performed. IMPRESSION: 1. 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 2. No  evidence of occlusion or hemodynamically significant stenosis of the left MCA territory. 3. Atherosclerotic changes of the right PCA with mild stenosis at the P2 segment. 4. Mild atherosclerotic disease in the bilateral carotid bifurcation without hemodynamically significant stenosis. PLAN: Case discussed with Dr. Lottie Rater. Decision made to proceed with aggressive medical management. In case of treatment failure, patient will be considered for intracranial angioplasty and stenting. Electronically Signed   By: Pedro Earls M.D.   On: 06/19/2021 10:30   IR ANGIO VERTEBRAL SEL VERTEBRAL BILAT MOD SED  Result Date: 06/19/2021 INDICATION: TRUMAN ACEITUNO is a 70 year old male with past medical history significant for colon cancer 0 presented to emergency with altered mental status, right right-sided weakness and neglect. MRI of the brain was positive for left ACA/MCA watershed infarcts and CT angiogram showed high-grade stenosis of the intracranial left ICA. Comes to our service for a diagnostic cerebral angiogram to evaluate intracranial stenosis. EXAM: ULTRASOUND-GUIDED VASCULAR ACCESS DIAGNOSTIC CEREBRAL ANGIOGRAM COMPARISON:  CTA head and neck June 17, 2021. MEDICATIONS: No antibiotics but. ANESTHESIA/SEDATION: Moderate (conscious) sedation was employed during this procedure. A total of Versed 1 mg and Fentanyl 25 mcg was administered intravenously by the radiology nurse. Total intra-service moderate Sedation Time: 44 minutes. The patient's level of consciousness and vital signs were monitored continuously by radiology nursing throughout the procedure under my direct supervision. CONTRAST:  65 mL of Omnipaque 300 milligram/mL FLUOROSCOPY TIME:  Fluoroscopy Time: 9 minutes 24 seconds (994 mGy). COMPLICATIONS: None immediate. TECHNIQUE: Informed written consent was obtained from the patient after a thorough discussion of the procedural risks, benefits and alternatives. All questions were  addressed. Maximal Sterile Barrier Technique was utilized including caps, mask, sterile gowns, sterile gloves, sterile drape, hand hygiene and skin antiseptic. A timeout was performed prior to the initiation of the procedure. The right groin was prepped and draped in the usual sterile fashion. Using a micropuncture kit and the modified Seldinger technique, access was gained to the right common femoral artery and a 5 French sheath was placed. Real-time ultrasound guidance was utilized for vascular access including the acquisition of a permanent ultrasound image documenting patency of the accessed vessel. Under fluoroscopy, a 5 [place] Berenstein 2 catheter and a 0.035" Terumo Glidewire into the aortic arch. The catheter was placed into the right common carotid artery. Frontal and lateral angiograms of the neck under biplane roadmap, the catheter was advanced into the right internal carotid  artery. Frontal, lateral, magnified right anterior oblique and magnified lateral views of the head were obtained. The catheter was then retracted and then advanced into the right subclavian artery. Under roadmap guidance, the catheter was advanced into the right vertebral artery. Townes and lateral views of the head were obtained. The catheter was then retracted and then advanced into the left subclavian artery. Under roadmap guidance, the catheter was advanced into the left vertebral artery. Townes and lateral views of the head were obtained. The catheter was pulled back and then advanced into the left common carotid artery. Frontal and lateral angiograms of the neck were obtained. Under biplane roadmap, the catheter was advanced into the left internal carotid artery. Frontal, lateral, magnified bilateral oblique, magnified lateral and magnified waters views of the head were obtained. The catheter was subsequently withdrawn. A common femoral artery angiogram was obtained in right anterior oblique view via sheath side port. Then, a  5 [place] Exoseal was utilized for access closure. Adequate hemostasis achieved. FINDINGS: Right CCA angiograms: Cervical angiograms show mild atherosclerotic changes at the distal right carotid bulb without hemodynamically significant stenosis. Right ICA angiograms: There is brisk vascular contrast filling of the right ACA and MCA vascular trees. There is also brisk vascular contrast opacification of the right PCA vascular tree by prominent posterior communicating artery. Mild luminal irregularity along the cavernous and supraclinoid right ICA with mild, non flow-limiting stenosis of the ophthalmic segment. No aneurysms or abnormally high-flow, early draining veins are seen. No regions of abnormal hypervascularity are noted. The visualized dural sinuses are patent. Right vertebral artery angiograms: The non dominant right vertebral artery, basilar artery, and left posterior cerebral artery are unremarkable. Luminal irregularity with mild stenosis at the proximal P2 segment of the right posterior cerebral artery. No aneurysms or abnormally high-flow, early draining veins are seen. No regions of abnormal hypervascularity are noted. The visualized dural sinuses are patent. Left vertebral artery angiograms: The dominant left vertebral artery, basilar artery, and left posterior cerebral artery are unremarkable. Luminal irregularity with mild stenosis at the proximal P2 segment of the right posterior cerebral artery. No aneurysms or abnormally high-flow, early draining veins are seen. No regions of abnormal hypervascularity are noted. The visualized dural sinuses are patent. Left CCA angiograms: Mild atherosclerotic changes of the distal left carotid bulb without hemodynamically significant stenosis. Left ICA angiograms: Luminal irregularity is seen from the petrous to terminus left ICA, more pronounced and at the ophthalmic/communicating segment where irregular plaque results in approximately 70% stenosis. There is brisk  vascular contrast filling of the left ACA and MCA vascular trees without evidence of significant atherosclerotic disease, hemodynamically significant stenosis or occlusion. No aneurysms or abnormally high-flow, early draining veins are seen. No regions of abnormal hypervascularity are noted. The visualized dural sinuses are patent. Right common femoral artery angiogram and ultrasound: The access is at the level of the mid right common femoral artery. Mild atherosclerotic changes are seen in the distal right common femoral artery without hemodynamically significant stenosis. The right common femoral artery has adequate for vascular access and for closure device utilization. PROCEDURE: No intervention performed. IMPRESSION: 1. 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 2. No evidence of occlusion or hemodynamically significant stenosis of the left MCA territory. 3. Atherosclerotic changes of the right PCA with mild stenosis at the P2 segment. 4. Mild atherosclerotic disease in the bilateral carotid bifurcation without hemodynamically significant stenosis. PLAN: Case discussed with Dr. Lottie Rater. Decision made to  proceed with aggressive medical management. In case of treatment failure, patient will be considered for intracranial angioplasty and stenting. Electronically Signed   By: Pedro Earls M.D.   On: 06/19/2021 10:30    12-lead ECG on arrival showed NSR with IVCD (personally reviewed) All prior EKG's in EP[facility] reviewed with no documented atrial fibrillation  Telemetry NSR (personally reviewed)  Assessment and Plan:  1. Cryptogenic stroke The patient presents with cryptogenic stroke.  The patient does not have a TEE planned.  I spoke at length with the patient about monitoring for afib with an implantable loop recorder.  Risks, benefits, and alteratives to implantable loop recorder were discussed with the  patient today.   At this time, the patient is very clear in their decision to proceed with implantable loop recorder.  2. Tobacco and Substance abuse UDS cocaine +. He denies frequent use and says this was a "rare" thing. The stroke has him scared and he wants to "do everything he can" to live.   Smoking and substance cessation advised  Wound care was reviewed with the patient (keep incision clean and dry for 3 days).  Wound check scheduled and entered in AVS. Please call with questions.    Shirley Friar, PA-C 06/19/2021 10:57 AM  I have seen, examined the patient, and reviewed the above assessment and plan.  Changes to above are made where necessary.  On exam, RRR.   The patient presents with cryptogenic stroke.   I spoke at length with the patient about monitoring for afib with an implantable loop recorder.  Risks, benefits, and alteratives to implantable loop recorder were discussed with the patient today.   At this time, the patient is very clear in their decision to proceed with implantable loop recorder.   Please call with questions.   Co Sign: Thompson Grayer, MD 06/19/2021 4:03 PM

## 2021-06-19 NOTE — Discharge Summary (Addendum)
Name: Philip Brooks Philip Brooks DOB: 13-Jan-1951 70 y.o. PCP: Pcp, No  Date of Admission: 06/17/2021 12:51 PM Date of Discharge: 06/19/21 Attending Physician: Angelica Pou, MD  Discharge Diagnosis: 1. Acute ischemic stroke  Discharge Medications: Allergies as of 06/19/2021       Reactions   Lactose Other (See Comments)   unknown        Medication List     TAKE these medications    aspirin 325 MG EC tablet Take 1 tablet (325 mg total) by mouth daily. Start taking on: June 20, 2021   atorvastatin 40 MG tablet Commonly known as: LIPITOR Take 1 tablet (40 mg total) by mouth daily. Start taking on: June 20, 2021   clopidogrel 75 MG tablet Commonly known as: PLAVIX Take 1 tablet (75 mg total) by mouth daily. Start taking on: June 20, 2021        Disposition and follow-up:   Philip Brooks was discharged from [facility] in Stable condition.  At the hospital follow up visit please address:  1.  Acute ischemic stroke- Follow up at neuro clinic for occupational therapy; continue DAPT: Plavix 75mg  and ASA 325mg  daily for three months then ASA alone indefinitely. Continue atorvastatin 40mg  daily for elevated LDL  2.  Labs / imaging needed at time of follow-up: implantable loop recorder  3.  Pending labs/ test needing follow-up: diagnostic cerebral angiogram  Follow-up Appointments:   Hospital Course by problem list: 1. Acute ischemic stroke-patient presented with complete loss of motor and sensory function.  Stroke code initiated.Patient was out of window for TPA treatment.  Neurology consulted.  MRI of head reveals multiple cortical and subcortical infarcts throughout the left frontal and parietal lobes, suggestive of watershed territory infarcts.  CBC insignificant, WBC WNL; CMP insignificant, no electrolyte imbalance, Cr function WNL; Coags WNL; Etoh <10.  HIV nonreactive; UA negative; TSH WNL; lipid panel significant for  elevated LDL at 134 and cholesterol 192; A1c 5.4%.  UDS positive for cocaine.  Patient started on DAPT and atorvastatin.  Patient was evaluated by PT and OT.  PT recommends no further physical therapy and OT recommends outpatient therapy at neuro clinic.  Throughout the hospital course patient slowly regained strength in his right arm and sensation intact.  Electrophysiologist placed implantable loop recorder to monitor for atrial fibrillation.  Patient stable at time of discharge.  Discharge Exam:   BP 115/66 (BP Location: Right Arm)   Pulse 60   Temp 97.8 F (36.6 C) (Oral)   Resp 13   Ht 5\' 7"  (1.702 m)   Wt 70.4 kg   SpO2 96%   BMI 24.31 kg/m  Discharge exam: Physical Exam Constitutional:      General: He is awake. He is not in acute distress. HENT:     Head: Normocephalic and atraumatic.  Eyes:     General: Lids are normal.  Cardiovascular:     Rate and Rhythm: Normal rate and regular rhythm.     Heart sounds: Normal heart sounds.  Pulmonary:     Effort: Pulmonary effort is normal.     Breath sounds: Normal breath sounds.  Musculoskeletal:     Right lower leg: No edema.     Left lower leg: No edema.  Skin:    General: Skin is warm and dry.  Neurological:     General: No focal deficit present.     Mental Status: He is alert.     Cranial Nerves: No  dysarthria or facial asymmetry.     Sensory: Sensation is intact.     Coordination: Finger-Nose-Finger Test normal.     Comments: Right arm- 3/5 strength (improved from previous day). Sensation intact   Psychiatric:        Attention and Perception: Attention normal.        Behavior: Behavior normal. Behavior is cooperative.        Thought Content: Thought content normal.     Pertinent Labs, Studies, and Procedures:  CBC Latest Ref Rng & Units 06/18/2021 06/17/2021 06/17/2021  WBC 4.0 - 10.5 K/uL 6.6 - 7.1  Hemoglobin 13.0 - 17.0 g/dL 14.3 14.6 14.1  Hematocrit 39.0 - 52.0 % 43.2 43.0 42.2  Platelets 150 - 400 K/uL 229 -  302   CMP Latest Ref Rng & Units 06/18/2021 06/17/2021 06/17/2021  Glucose 70 - 99 mg/dL 83 94 98  BUN 8 - 23 mg/dL 13 12 20   Creatinine 0.61 - 1.24 mg/dL 1.00 1.00 0.90  Sodium 135 - 145 mmol/L 138 137 139  Potassium 3.5 - 5.1 mmol/L 4.3 3.9 6.8(HH)  Chloride 98 - 111 mmol/L 103 101 101  CO2 22 - 32 mmol/L 26 28 -  Calcium 8.9 - 10.3 mg/dL 8.9 8.9 -  Total Protein 6.5 - 8.1 g/dL 6.7 6.6 -  Total Bilirubin 0.3 - 1.2 mg/dL 0.9 0.7 -  Alkaline Phos 38 - 126 U/L 73 62 -  AST 15 - 41 U/L 20 18 -  ALT 0 - 44 U/L 14 14 -   BMP Latest Ref Rng & Units 06/18/2021 06/17/2021 06/17/2021  Glucose 70 - 99 mg/dL 83 94 98  BUN 8 - 23 mg/dL 13 12 20   Creatinine 0.61 - 1.24 mg/dL 1.00 1.00 0.90  Sodium 135 - 145 mmol/L 138 137 139  Potassium 3.5 - 5.1 mmol/L 4.3 3.9 6.8(HH)  Chloride 98 - 111 mmol/L 103 101 101  CO2 22 - 32 mmol/L 26 28 -  Calcium 8.9 - 10.3 mg/dL 8.9 8.9 -   Lipid Panel     Component Value Date/Time   CHOL 192 06/18/2021 0927   TRIG 69 06/18/2021 0927   HDL 44 06/18/2021 0927   CHOLHDL 4.4 06/18/2021 0927   VLDL 14 06/18/2021 0927   LDLCALC 134 (H) 06/18/2021 0927   Drugs of Abuse     Component Value Date/Time   LABOPIA NONE DETECTED 06/17/2021 1705   COCAINSCRNUR POSITIVE (A) 06/17/2021 1705   LABBENZ NONE DETECTED 06/17/2021 1705   AMPHETMU NONE DETECTED 06/17/2021 1705   THCU NONE DETECTED 06/17/2021 1705   LABBARB NONE DETECTED 06/17/2021 1705    CT HEAD WITHOUT CONTRAST IMPRESSION: Small age-indeterminate left MCA territory cortically-based infarcts within the left frontal and parietal lobes. Additional 11 mm focal hypodensity within the left parietooccipital lobe subcortical white matter, which may reflect an age-indeterminate white matter infarct. ASPECTS is 8. No evidence of acute intracranial hemorrhage.   Background mild chronic small vessel ischemic changes within the cerebral white matter.   Indeterminate circumscribed lucent lesions within the  left parietal calvarium and superior midline occipital calvarium, measuring up to 15 mm. Short-interval 48-month non-contrast head CT follow-up recommended to ensure size stability.  CT ANGIOGRAPHY HEAD AND NECK  CTA neck: IMPRESSION 1. The common carotid and internal carotid arteries are patent within the neck without stenosis. Minimal calcified plaque within the carotid bifurcations and proximal ICAs, bilaterally. 2. Streak and beam hardening artifact arising from a dense left-sided contrast bolus partially obscures the V1 left  vertebral artery. Within this limitation, the vertebral arteries are patent within the neck without appreciable stenosis.   CTA head:   1. No intracranial large vessel occlusion. 2. Intracranial atherosclerotic disease with multifocal stenoses, most notably as follows. 3. Severe stenosis within the distal cavernous/paraclinoid left ICA. 4. Severe stenosis within a proximal-to-mid M2 left MCA vessel. 5. Up to moderate stenosis within the distal cavernous/paraclinoid right ICA.   CT perfusion head:   The perfusion software identifies no core infarct. The perfusion software identifies no critically hypoperfused parenchyma utilizing the Tmax>6 seconds threshold. However, there is a 5 mL region of hypoperfusion within the left frontoparietal lobes when utilizing the T-max greater than 4 seconds threshold. The perfusion software reports no mismatch volume.   MRI HEAD WITHOUT AND WITH CONTRAST IMPRESSION: 1. Multiple cortical and subcortical infarcts throughout the left frontal and parietal lobes, suggestive of watershed territory infarcts (both cortical and internal border zones). Mild associated edema without significant mass effect. 2. Moderate chronic microvascular ischemic disease. 3. Calvarial lesions described on same day CT head are intrinsically T1 hyperintense, probably benign. The previously recommended 3 month CT head follow up could confirm  stability.  DIAGNOSTIC CEREBRAL ANGIOGRAM   IMPRESSION: 1. 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 2. No evidence of occlusion or hemodynamically significant stenosis of the left MCA territory. 3. Atherosclerotic changes of the right PCA with mild stenosis at the P2 segment. 4. Mild atherosclerotic disease in the bilateral carotid bifurcation without hemodynamically significant stenosis.  Discharge Instructions:  Mr. Aumiller, It was a pleasure taking care of you at [place] were admitted for stroke. We are discharging you home now that you are doing better. Please follow the following instructions.  1) Start taking Aspirin 325 mg daily and Lipitor 40 mg daily  2) Start taking plavix 75 mg daily for 3 months 3) Follow up with your primary care doctor via the [facility] 4) Follow up with outpatient occupational therapy and outpatient neurology  Take care,  Dr. Linwood Dibbles, MD, MPH    Signed: Timothy Lasso, MD 06/19/2021, 2:29 PM   Pager: [phone]

## 2021-06-19 NOTE — Progress Notes (Addendum)
   Subjective: I seen and evaluated Mr. Hinks at bedside. He reports feeling much better. He endorses improvement in function of his right arm. He is able to lift his arm  up to 90 degree horizontally, sensation is intact. He denies any complaints at this time.  Objective:  Vital signs in last 24 hours: Vitals:   06/18/21 2331 06/19/21 0323 06/19/21 0757 06/19/21 1202  BP: 103/71 133/73 116/67 115/66  Pulse: 62 68 67 60  Resp: 19 15 11 13   Temp: 97.8 F (36.6 C) 98 F (36.7 C) 97.8 F (36.6 C) 97.8 F (36.6 C)  TempSrc: Oral Oral Oral Oral  SpO2: 97% 100% 96% 96%  Weight:      Height:       Physical Exam Constitutional:      General: He is awake.     Appearance: Normal appearance.  HENT:     Head: Normocephalic and atraumatic.  Eyes:     General: Lids are normal.  Cardiovascular:     Rate and Rhythm: Normal rate and regular rhythm.     Heart sounds: Normal heart sounds.  Pulmonary:     Effort: Pulmonary effort is normal.     Breath sounds: Normal breath sounds and air entry.  Musculoskeletal:     Right lower leg: No edema.     Left lower leg: No edema.  Skin:    General: Skin is warm and dry.  Neurological:     General: No focal deficit present.     Mental Status: He is alert.     Cranial Nerves: No dysarthria or facial asymmetry.     Coordination: Finger-Nose-Finger Test normal.     Comments: Right arm- 3/5 strength (improved from previous day). Sensation intact  Psychiatric:        Behavior: Behavior normal. Behavior is cooperative.        Thought Content: Thought content normal.        Cognition and Memory: Cognition normal.     Comments: Patient pleasant     Assessment/Plan:  Active Problems:   Acute ischemic stroke (HCC)  Acute ischemic stroke Diagnostic cerebral angiogram performed yesterday revealed 70% left ICA stenosis. Remaining labs reveal TSH WNL; HIV non-reactive and UDS positive for cocaine. Electrophysiologist placed implantable loop recorder  to monitor for atrial fibrillation. PT recommends no PT follow up. OT recommends outpatient therapy at neuro center and to refrain from driving. Patient has been normotensive. Slowly regaining strength in the right arm; sensation intact. --On DAPT --atorvastatin 40 mg daily --BP permissive to 220/120 --Continue telemetry --OT evaluation pending   Prior to Admission Living Arrangement: Anticipated Discharge Location: Barriers to Discharge: Dispo: Anticipated discharge in approximately 1-2 day(s).   Timothy Lasso, MD 06/19/2021, 12:51 PM Pager: [phone] After 5pm on weekdays and 1pm on weekends: On Call pager [phone]

## 2021-06-19 NOTE — H&P (View-Only) (Signed)
ELECTROPHYSIOLOGY CONSULT NOTE  Patient ID: Philip Brooks MRN: [id number], DOB/AGE: October 10, 1950   Admit date: 06/17/2021 Date of Consult: 06/19/2021  Primary Physician: Merryl Hacker, No Primary Cardiologist: None  Primary Electrophysiologist: New to Dr. Rayann Heman Reason for Consultation: Cryptogenic stroke; recommendations regarding Implantable Loop Recorder Insurance: [facility] Medicare  History of Present Illness EP has been asked to evaluate Philip Brooks for placement of an implantable loop recorder to monitor for atrial fibrillation by Dr Erlinda Hong.  The patient was admitted on 06/17/2021 with RUE weakness, confusion, and aphasia.    Imaging demonstrated acute left MCA infarct embolic secondary to small vessel disease source.    He has undergone workup for stroke including:  Code Stroke CT head -Small age-indeterminate left MCA territory cortically-based infarcts within the left frontal and parietal lobes. Additional 11 mm focal hypodensity within the left parietooccipital lobe subcortical white matter, which may reflect an age-indeterminate white matter infarct. ASPECTS is 8. No evidence of acute intracranial hemorrhage. Background mild chronic small vessel ischemic changes within the cerebral white matter. Indeterminate circumscribed lucent lesions within the left parietal calvarium and superior midline occipital calvarium, measuring up to 15 mm. Short-interval 43-monthnon-contrast head CT follow-up recommended to ensure size stability. CTA head - 1. No intracranial large vessel occlusion. 2. Intracranial atherosclerotic disease with multifocal stenoses, most notably as follows. 3. Severe stenosis within the distal cavernous/paraclinoid left ICA. 4. Severe stenosis within a proximal-to-mid M2 left MCA vessel. 5. Up to moderate stenosis within the distal cavernous/paraclinoid right ICA.  CTA neck: 1. The common carotid and internal carotid arteries are patent within the neck without stenosis. Minimal calcified  plaque within the carotid bifurcations and proximal ICAs, bilaterally. 2. Streak and beam hardening artifact arising from a dense left-sided contrast bolus partially obscures the V1 left vertebral artery. Within this limitation, the vertebral arteries are patent within the neck without appreciable stenosis. CT perfusion  The perfusion software identifies no core infarct. The perfusion software identifies no critically hypoperfused parenchyma utilizing the Tmax>6 seconds threshold. However, there is a 5 mL region of hypoperfusion within the left frontoparietal lobes when utilizing the T-max greater than 4 seconds threshold. The perfusion software reports no mismatch volume. Cerebral angio   1. 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 2. No evidence of occlusion or  hemodynamically significant stenosis of the left MCA territory. 3. Atherosclerotic changes of the right PCA with mild stenosis at the P2 segment. 4. Mild atherosclerotic disease in the bilateral carotid bifurcation without hemodynamically significant stenosis. MRI   1. Multiple cortical and subcortical infarcts throughout the left frontal and parietal lobes, suggestive of watershed territory infarcts (both cortical and internal border zones). Mild associated edema without significant mass effect. 2. Moderate chronic microvascular ischemic disease. 3. Calvarial lesions described on same day CT head are intrinsically T1 hyperintense, probably benign. The previously recommended 3 month CT head follow up could confirm stability.  2D Echo EF 50-55%. No shunt Bilateral LE venous doppler  - No evidence of deep vein thrombosis seen in the lower extremities, bilaterally.  -No evidence of popliteal cyst, bilaterally LDL 134 HgbA1c 5.4 VTE prophylaxis - Lovenox/ SCD's    The patient has been monitored on telemetry which has demonstrated sinus rhythm with no  arrhythmias.  Inpatient stroke work-up will not require a TEE per Neurology.   Echocardiogram as above. Lab work is reviewed.  Prior to admission, the patient denies chest pain, shortness of breath, dizziness,  palpitations, or syncope.  He is recovering from his stroke with plans for outpatient OT  at discharge.  Past Medical History:  Diagnosis Date   Colon cancer ([place]) 2007   S/P chemo/radiation/bowel resection     Surgical History:  Past Surgical History:  Procedure Laterality Date   IR ANGIO INTRA EXTRACRAN SEL INTERNAL CAROTID BILAT MOD SED  06/18/2021   IR ANGIO VERTEBRAL SEL VERTEBRAL BILAT MOD SED  06/18/2021   IR US GUIDE VASC ACCESS RIGHT  06/18/2021     No medications prior to admission.    Inpatient Medications:   aspirin EC  325 mg Oral Daily   atorvastatin  40 mg Oral Daily   clopidogrel  75 mg Oral Daily   enoxaparin (LOVENOX) injection  40 mg Subcutaneous Q24H    Allergies:  Allergies  Allergen Reactions   Lactose Other (See Comments)    unknown    Social History   Socioeconomic History   Marital status: Single    Spouse name: Not on file   Number of children: Not on file   Years of education: Not on file   Highest education level: Not on file  Occupational History   Not on file  Tobacco Use   Smoking status: Every Day    Packs/day: 1.50    Years: 43.00    Pack years: 64.50    Types: Cigarettes   Smokeless tobacco: Not on file  Substance and Sexual Activity   Alcohol use: Not on file   Drug use: Not Currently   Sexual activity: Not on file  Other Topics Concern   Not on file  Social History Narrative   Not on file   Social Determinants of Health   Financial Resource Strain: Not on file  Food Insecurity: Not on file  Transportation Needs: Not on file  Physical Activity: Not on file  Stress: Not on file  Social Connections: Not on file  Intimate Partner Violence: Not on file     Family History  Problem Relation Age of Onset    Diabetes Mother    Cancer Father       Review of Systems: All other systems reviewed and are otherwise negative except as noted above.  Physical Exam: Vitals:   06/18/21 1927 06/18/21 2331 06/19/21 0323 06/19/21 0757  BP: 110/63 103/71 133/73 116/67  Pulse: 68 62 68 67  Resp: _0 Temp: 97.7 F (36.5 C) 97.8 F (36.6 C) 98 F (36.7 C) 97.8 F (36.6 C)  TempSrc: Oral Oral Oral Oral  SpO2: 96% 97% 100% 96%  Weight:      Height:        GEN- The patient is well appearing, alert and oriented x 3 today.   Head- normocephalic, atraumatic Eyes-  Sclera clear, conjunctiva pink Ears- hearing intact Oropharynx- clear Neck- supple Lungs- Clear to ausculation bilaterally, normal work of breathing Heart- Regular rate and rhythm, no murmurs, rubs or gallops  GI- soft, NT, ND, + BS Extremities- no clubbing, cyanosis, or edema MS- no significant deformity or atrophy Skin- no rash or lesion Psych- euthymic mood, full affect + RUE deficit. Can only lift to about 3/4 shoulder height. Hand weakness   Labs:   Lab Results  Component Value Date   WBC 6.6 06/18/2021   HGB 14.3 06/18/2021   HCT 43.2 06/18/2021   MCV 89.8 06/18/2021   PLT 229 06/18/2021    Recent Labs  Lab 06/18/21 0927  NA 138  K 4.3  CL 103  CO2 26  BUN 13  CREATININE 1.00  CALCIUM 8.9  PROT 6.7  BILITOT 0.9  ALKPHOS 73  ALT 14  AST 20  GLUCOSE 83     Radiology/Studies: MR BRAIN W WO CONTRAST  Result Date: 06/17/2021 CLINICAL DATA:  Neuro deficit, acute, stroke suspected EXAM: MRI HEAD WITHOUT AND WITH CONTRAST TECHNIQUE: Multiplanar, multiecho pulse sequences of the brain and surrounding structures were obtained without and with intravenous contrast. CONTRAST:  7.18m GADAVIST GADOBUTROL 1 MMOL/ML IV SOLN COMPARISON:  Same day CT head. FINDINGS: Brain: Multiple small acute infarcts throughout the left frontal and parietal cortex and white matter. Additional scattered moderate T2/FLAIR  hyperintensities in the white matter, nonspecific but compatible with chronic microvascular ischemic disease. No evidence of acute hemorrhage, hydrocephalus, mass lesion, midline shift, or extra-axial fluid collection. No abnormal enhancement. Vascular: Better evaluated on same day CTA. Skull and upper cervical spine: Calvarial lesions described on same day CT head are intrinsically T1 hyperintense. Degenerative change in the visualized upper cervical spine. Sinuses/Orbits: Mild paranasal sinus mucosal thickening. Unremarkable orbits. Other: No mastoid effusions. IMPRESSION: 1. Multiple cortical and subcortical infarcts throughout the left frontal and parietal lobes, suggestive of watershed territory infarcts (both cortical and internal border zones). Mild associated edema without significant mass effect. 2. Moderate chronic microvascular ischemic disease. 3. Calvarial lesions described on same day CT head are intrinsically T1 hyperintense, probably benign. The previously recommended 3 month CT head follow up could confirm stability. Electronically Signed   By: FMargaretha SheffieldM.D.   On: 06/17/2021 15:35   IR [place] Vasc Access Right  Result Date: 06/19/2021 INDICATION: JRUPERT AZZARAis a 70year old male with past medical history significant for colon cancer 0 presented to emergency with altered mental status, right right-sided weakness and neglect. MRI of the brain was positive for left ACA/MCA watershed infarcts and CT angiogram showed high-grade stenosis of the intracranial left ICA. Comes to our service for a diagnostic cerebral angiogram to evaluate intracranial stenosis. EXAM: ULTRASOUND-GUIDED VASCULAR ACCESS DIAGNOSTIC CEREBRAL ANGIOGRAM COMPARISON:  CTA head and neck June 17, 2021. MEDICATIONS: No antibiotics but. ANESTHESIA/SEDATION: Moderate (conscious) sedation was employed during this procedure. A total of Versed 1 mg and Fentanyl 25 mcg was administered intravenously by the radiology  nurse. Total intra-service moderate Sedation Time: 44 minutes. The patient's level of consciousness and vital signs were monitored continuously by radiology nursing throughout the procedure under my direct supervision. CONTRAST:  65 mL of Omnipaque 300 milligram/mL FLUOROSCOPY TIME:  Fluoroscopy Time: 9 minutes 24 seconds (994 mGy). COMPLICATIONS: None immediate. TECHNIQUE: Informed written consent was obtained from the patient after a thorough discussion of the procedural risks, benefits and alternatives. All questions were addressed. Maximal Sterile Barrier Technique was utilized including caps, mask, sterile gowns, sterile gloves, sterile drape, hand hygiene and skin antiseptic. A timeout was performed prior to the initiation of the procedure. The right groin was prepped and draped in the usual sterile fashion. Using a micropuncture kit and the modified Seldinger technique, access was gained to the right common femoral artery and a 5 French sheath was placed. Real-time ultrasound guidance was utilized for vascular access including the acquisition of a permanent ultrasound image documenting patency of the accessed vessel. Under fluoroscopy, a 5 [place] 2 catheter and a 0.035" Terumo Glidewire into the aortic arch. The catheter was placed into the right common carotid artery. Frontal and lateral angiograms of the neck under biplane roadmap, the catheter was advanced into the  right internal carotid artery. Frontal, lateral, magnified right anterior oblique and magnified lateral views of the head were obtained. The catheter was then retracted and then advanced into the right subclavian artery. Under roadmap guidance, the catheter was advanced into the right vertebral artery. Townes and lateral views of the head were obtained. The catheter was then retracted and then advanced into the left subclavian artery. Under roadmap guidance, the catheter was advanced into the left vertebral artery. Townes and lateral  views of the head were obtained. The catheter was pulled back and then advanced into the left common carotid artery. Frontal and lateral angiograms of the neck were obtained. Under biplane roadmap, the catheter was advanced into the left internal carotid artery. Frontal, lateral, magnified bilateral oblique, magnified lateral and magnified waters views of the head were obtained. The catheter was subsequently withdrawn. A common femoral artery angiogram was obtained in right anterior oblique view via sheath side port. Then, a 5 [place] Exoseal was utilized for access closure. Adequate hemostasis achieved. FINDINGS: Right CCA angiograms: Cervical angiograms show mild atherosclerotic changes at the distal right carotid bulb without hemodynamically significant stenosis. Right ICA angiograms: There is brisk vascular contrast filling of the right ACA and MCA vascular trees. There is also brisk vascular contrast opacification of the right PCA vascular tree by prominent posterior communicating artery. Mild luminal irregularity along the cavernous and supraclinoid right ICA with mild, non flow-limiting stenosis of the ophthalmic segment. No aneurysms or abnormally high-flow, early draining veins are seen. No regions of abnormal hypervascularity are noted. The visualized dural sinuses are patent. Right vertebral artery angiograms: The non dominant right vertebral artery, basilar artery, and left posterior cerebral artery are unremarkable. Luminal irregularity with mild stenosis at the proximal P2 segment of the right posterior cerebral artery. No aneurysms or abnormally high-flow, early draining veins are seen. No regions of abnormal hypervascularity are noted. The visualized dural sinuses are patent. Left vertebral artery angiograms: The dominant left vertebral artery, basilar artery, and left posterior cerebral artery are unremarkable. Luminal irregularity with mild stenosis at the proximal P2 segment of the right posterior  cerebral artery. No aneurysms or abnormally high-flow, early draining veins are seen. No regions of abnormal hypervascularity are noted. The visualized dural sinuses are patent. Left CCA angiograms: Mild atherosclerotic changes of the distal left carotid bulb without hemodynamically significant stenosis. Left ICA angiograms: Luminal irregularity is seen from the petrous to terminus left ICA, more pronounced and at the ophthalmic/communicating segment where irregular plaque results in approximately 70% stenosis. There is brisk vascular contrast filling of the left ACA and MCA vascular trees without evidence of significant atherosclerotic disease, hemodynamically significant stenosis or occlusion. No aneurysms or abnormally high-flow, early draining veins are seen. No regions of abnormal hypervascularity are noted. The visualized dural sinuses are patent. Right common femoral artery angiogram and ultrasound: The access is at the level of the mid right common femoral artery. Mild atherosclerotic changes are seen in the distal right common femoral artery without hemodynamically significant stenosis. The right common femoral artery has adequate for vascular access and for closure device utilization. PROCEDURE: No intervention performed. IMPRESSION: 1. 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 2. No evidence of occlusion or hemodynamically significant stenosis of the left MCA territory. 3. Atherosclerotic changes of the right PCA with mild stenosis at the P2 segment. 4. Mild atherosclerotic disease in the bilateral carotid bifurcation without hemodynamically significant stenosis. PLAN: Case discussed with Dr. Lottie Rater.  Decision made to proceed with aggressive medical management. In case of treatment failure, patient will be considered for intracranial angioplasty and stenting. Electronically Signed   By: Pedro Earls  M.D.   On: 06/19/2021 10:30   ECHOCARDIOGRAM COMPLETE  Result Date: 06/18/2021    ECHOCARDIOGRAM REPORT   Patient Name:   Philip Brooks Date of Exam: 06/18/2021 Medical Rec #:  [id number]     Height: Accession #:    [id number]    Weight: Date of Birth:  [birth year]     BSA: Patient Age:    70 years      BP:           122/75 mmHg Patient Gender: M             HR:           68 bpm. Exam Location:  Inpatient Procedure: 2D Echo, Cardiac Doppler and Color Doppler Indications:   Stroke  History:       Patient has no prior history of Echocardiogram examinations.                Stroke.  Sonographer:   Glo Herring Referring      [place] Phys: IMPRESSIONS  1. Left ventricular ejection fraction, by estimation, is 50 to 55%. The left ventricle has low normal function. The left ventricle has no regional wall motion abnormalities. There is mild concentric left ventricular hypertrophy. Left ventricular diastolic parameters were normal.  2. Right ventricular systolic function is normal. The right ventricular size is normal. Tricuspid regurgitation signal is inadequate for assessing PA pressure.  3. Right atrial size was mildly dilated.  4. The mitral valve is normal in structure. Trivial mitral valve regurgitation. No evidence of mitral stenosis.  5. The aortic valve is normal in structure. Aortic valve regurgitation is not visualized. No aortic stenosis is present.  6. The inferior vena cava is normal in size with greater than 50% respiratory variability, suggesting right atrial pressure of 3 mmHg. Comparison(s): No prior Echocardiogram. FINDINGS  Left Ventricle: Left ventricular ejection fraction, by estimation, is 50 to 55%. The left ventricle has low normal function. The left ventricle has no regional wall motion abnormalities. The left ventricular internal cavity size was small. There is mild  concentric left ventricular hypertrophy. Left ventricular diastolic parameters were normal. Right Ventricle: The  right ventricular size is normal. No increase in right ventricular wall thickness. Right ventricular systolic function is normal. Tricuspid regurgitation signal is inadequate for assessing PA pressure. Left Atrium: Left atrial size was normal in size. Right Atrium: Right atrial size was mildly dilated. Pericardium: There is no evidence of pericardial effusion. Presence of epicardial fat layer. Mitral Valve: The mitral valve is normal in structure. Trivial mitral valve regurgitation. No evidence of mitral valve stenosis. Tricuspid Valve: The tricuspid valve is normal in structure. Tricuspid valve regurgitation is not demonstrated. No evidence of tricuspid stenosis. Aortic Valve: The aortic valve is normal in structure. Aortic valve regurgitation is not visualized. No aortic stenosis is present. Aortic valve mean gradient measures 4.0 mmHg. Aortic valve peak gradient measures 6.8 mmHg. Aortic valve area, by VTI measures 21.47 cm. Pulmonic Valve: The pulmonic valve was not well visualized. Pulmonic valve regurgitation is not visualized. No evidence of pulmonic stenosis. Aorta: The aortic root is normal in size and structure. Venous: The inferior vena cava is normal in size with greater than 50% respiratory variability, suggesting right atrial pressure of 3 mmHg. IAS/Shunts: No atrial  level shunt detected by color flow Doppler.  LEFT VENTRICLE PLAX 2D LVIDd:         3.30 cm   Diastology LVIDs:         2.40 cm   LV e' medial:    5.22 cm/s LV PW:         1.30 cm   LV E/e' medial:  9.8 LV IVS:        1.30 cm   LV e' lateral:   7.83 cm/s LVOT diam:     2.00 cm   LV E/e' lateral: 6.6 LV SV:         5152 LVOT Area:     3.14 cm  RIGHT VENTRICLE RV Basal diam:  3.90 cm LEFT ATRIUM LA diam:      2.90 cm LA Vol (A2C): 44.4 ml LA Vol (A4C): 31.2 ml  AORTIC VALVE                     PULMONIC VALVE AV Area (Vmax):    2.46 cm      PV Vmax:       1.08 m/s AV Area (Vmean):   2.01 cm      PV Peak grad:  4.7 mmHg AV Area (VTI):      21.47 cm AV Vmax:           130.00 cm/s AV Vmean:          900.000 cm/s AV VTI:            2.400 m AV Peak Grad:      6.8 mmHg AV Mean Grad:      4.0 mmHg LVOT Vmax:         102.00 cm/s LVOT Vmean:        575.000 cm/s LVOT VTI:          16.400 m LVOT/AV VTI ratio: 6.83 MV E velocity: 51.40 cm/s MV A velocity: 5140.00 cm/s  SHUNTS MV E/A ratio:  0.01          Systemic VTI:  16.40 m                              Systemic Diam: 2.00 cm Kardie Tobb DO Electronically signed by Berniece Salines DO Signature Date/Time: 06/18/2021/2:51:34 PM    Final    CT HEAD CODE STROKE WO CONTRAST  Addendum Date: 06/17/2021   ADDENDUM REPORT: 06/17/2021 13:42 ADDENDUM: These results were called by telephone at the time of interpretation on 06/17/2021 at 1:40 pm to provider Dr. Curly Shores, who verbally acknowledged these results. Electronically Signed   By: Kellie Simmering D.O.   On: 06/17/2021 13:42   Result Date: 06/17/2021 CLINICAL DATA:  Code stroke. Neuro deficit, acute, stroke suspected. EXAM: CT HEAD WITHOUT CONTRAST TECHNIQUE: Contiguous axial images were obtained from the base of the skull through the vertex without intravenous contrast. COMPARISON:  No pertinent prior exams available for comparison. FINDINGS: Brain: Cerebral volume is normal. Small age-indeterminate cortically based infarcts within the left frontal and parietal lobes (for instance as seen on series 2, image 20) (series 2, image 23). Additionally, there is an 11 mm focal subcortical hypodensity within the left parietooccipital lobes which may reflect an age-indeterminate subcortical infarct (series 17, image 17) (series 5, image 56). Background mild patchy and ill-defined hypoattenuation within the cerebral white matter, nonspecific but compatible chronic small vessel ischemic disease. There is no acute intracranial hemorrhage. No  extra-axial fluid collection. No evidence of an intracranial mass. No midline shift. Vascular: No hyperdense vessel.  Atherosclerotic  calcifications. Skull: No calvarial fracture. Nonspecific circumscribed lucent lesions within the left parietal calvarium and superior occipital calvarium measuring up to 15 mm. Sinuses/Orbits: Visualized orbits show no acute finding. No significant paranasal sinus disease at the imaged levels. ASPECTS ([place] Stroke Program Early CT Score) - Ganglionic level infarction (caudate, lentiform nuclei, internal capsule, insula, M1-M3 cortex): 7 - Supraganglionic infarction (M4-M6 cortex): 1 Total score (0-10 with 10 being normal): 8 IMPRESSION: Small age-indeterminate left MCA territory cortically-based infarcts within the left frontal and parietal lobes. Additional 11 mm focal hypodensity within the left parietooccipital lobe subcortical white matter, which may reflect an age-indeterminate white matter infarct. ASPECTS is 8. No evidence of acute intracranial hemorrhage. Background mild chronic small vessel ischemic changes within the cerebral white matter. Indeterminate circumscribed lucent lesions within the left parietal calvarium and superior midline occipital calvarium, measuring up to 15 mm. Short-interval 72-monthnon-contrast head CT follow-up recommended to ensure size stability. Electronically Signed: By: KKellie SimmeringD.O. On: 06/17/2021 13:38   VAS [place]OWER EXTREMITY VENOUS (DVT)  Result Date: 06/18/2021  Lower Venous DVT Study Patient Name:  JMILO SCHREIER Date of Exam:   06/18/2021 Medical Rec #: [id number]     Accession #:    [id number] of Birth: [birth year]     Patient Gender: M Patient Age:   89years [facility]      VAS [place]OWER EXTREMITY VENOUS (DVT) Referring Phys: JCornelius MorasXU --------------------------------------------------------------------------------  Indications: Stroke.  Comparison Study: no prior Performing Technologist: MArchie PattenRVS  Examination Guidelines: A complete evaluation includes B-mode imaging, spectral Doppler, color Doppler, and  power Doppler as needed of all accessible portions of each vessel. Bilateral testing is considered an integral part of a complete examination. Limited examinations for reoccurring indications may be performed as noted. The reflux portion of the exam is performed with the patient in reverse Trendelenburg.  +---------+---------------+---------+-----------+----------+--------------+ RIGHT    CompressibilityPhasicitySpontaneityPropertiesThrombus Aging +---------+---------------+---------+-----------+----------+--------------+ CFV      Full           Yes      Yes                                 +---------+---------------+---------+-----------+----------+--------------+ SFJ      Full                                                        +---------+---------------+---------+-----------+----------+--------------+ FV Prox  Full                                                        +---------+---------------+---------+-----------+----------+--------------+ FV Mid   Full                                                        +---------+---------------+---------+-----------+----------+--------------+ FV  DistalFull                                                        +---------+---------------+---------+-----------+----------+--------------+ PFV      Full                                                        +---------+---------------+---------+-----------+----------+--------------+ POP      Full           Yes      Yes                                 +---------+---------------+---------+-----------+----------+--------------+ PTV      Full                                                        +---------+---------------+---------+-----------+----------+--------------+ PERO     Full                                                        +---------+---------------+---------+-----------+----------+--------------+    +---------+---------------+---------+-----------+----------+--------------+ LEFT     CompressibilityPhasicitySpontaneityPropertiesThrombus Aging +---------+---------------+---------+-----------+----------+--------------+ CFV      Full           Yes      Yes                                 +---------+---------------+---------+-----------+----------+--------------+ SFJ      Full                                                        +---------+---------------+---------+-----------+----------+--------------+ FV Prox  Full                                                        +---------+---------------+---------+-----------+----------+--------------+ FV Mid   Full                                                        +---------+---------------+---------+-----------+----------+--------------+ FV DistalFull                                                        +---------+---------------+---------+-----------+----------+--------------+  PFV      Full                                                        +---------+---------------+---------+-----------+----------+--------------+ POP      Full           Yes      Yes                                 +---------+---------------+---------+-----------+----------+--------------+ PTV      Full                                                        +---------+---------------+---------+-----------+----------+--------------+ PERO     Full                                                        +---------+---------------+---------+-----------+----------+--------------+     Summary: BILATERAL: - No evidence of deep vein thrombosis seen in the lower extremities, bilaterally. -No evidence of popliteal cyst, bilaterally.   *See table(s) above for measurements and observations. Electronically signed by Monica Martinez MD on 06/18/2021 at 12:02:45 PM.    Final    CT ANGIO HEAD NECK W WO CM W PERF (CODE  STROKE)  Result Date: 06/17/2021 CLINICAL DATA:  Stroke code.  Right-sided deficits. EXAM: CT ANGIOGRAPHY HEAD AND NECK CT PERFUSION BRAIN TECHNIQUE: Multidetector CT imaging of the head and neck was performed using the standard protocol during bolus administration of intravenous contrast. Multiplanar CT image reconstructions and MIPs were obtained to evaluate the vascular anatomy. Carotid stenosis measurements (when applicable) are obtained utilizing NASCET criteria, using the distal internal carotid diameter as the denominator. Multiphase CT imaging of the brain was performed following IV bolus contrast injection. Subsequent parametric perfusion maps were calculated using RAPID software. CONTRAST:  155m OMNIPAQUE IOHEXOL 350 MG/ML SOLN COMPARISON:  Noncontrast head CT performed earlier today 06/17/2021. FINDINGS: CTA NECK FINDINGS Aortic arch: Common origin of the innominate and left common carotid arteries. Atherosclerotic plaque within the visualized aortic arch and proximal major branch vessels of the neck. Streak and beam hardening artifact arising from a dense left-sided contrast bolus partially obscures the left subclavian artery. Within this limitation, there is no appreciable hemodynamically significant stenosis within the innominate or proximal subclavian arteries. Right carotid system: CCA and ICA patent within the neck without stenosis. Minimal calcified plaque within the carotid bifurcation and proximal ICA. Left carotid system: CCA and ICA patent within the neck without stenosis. Minimal calcified plaque within the carotid bifurcation and proximal ICA. Vertebral arteries: The non dominant right vertebral artery is patent within the neck without stenosis. Minimal nonstenotic calcified plaque at the origin of this vessel. The dominant left vertebral artery is patent within the neck. Streak and beam hardening artifact arising from a dense left-sided contrast bolus partially obscures the V1 left  vertebral artery. Within this limitation, there is no appreciable hemodynamically significant stenosis within the left  cervical vertebral artery. 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 Reversal of the expected cervical lordosis. Cervical spondylosis. No acute bony abnormality or aggressive osseous lesion. Partially imaged thoracic dextrocurvature. Other neck: No neck mass or cervical lymphadenopathy. Upper chest: Centrilobular and paraseptal emphysema. No consolidation within the imaged lung apices. Review of the MIP images confirms the above findings CTA HEAD FINDINGS Anterior circulation: The intracranial internal carotid arteries are patent. Calcified plaque within both vessels. Up to moderate stenosis within the distal cavernous/paraclinoid right ICA. Severe stenosis within the distal cavernous/paraclinoid left ICA. The M1 middle cerebral arteries are patent. Severe focal stenosis within a proximal to mid left M2 MCA vessel (series 12, image 31) (series 7, image 96). No right M2 proximal branch occlusion or high-grade proximal stenosis is identified. The anterior cerebral arteries are patent. No intracranial aneurysm is identified. Posterior circulation: The intracranial vertebral arteries are patent. Nonstenotic calcified plaque within the proximal V4 left vertebral artery. The basilar artery is patent. The posterior cerebral arteries are patent. A right posterior communicating artery is present. Minimal atherosclerotic irregularity of the P2 left posterior cerebral artery. The left posterior communicating artery is hypoplastic or absent. Venous sinuses: Within the limitations of contrast timing, no convincing thrombus. Anatomic variants: As described. Review of the MIP images confirms the above findings CT Brain Perfusion Findings: CBF (<30%) Volume: 34m Perfusion (Tmax>6.0s) volume: 037m(however, there is a 5 mL region of hypoperfusion within the left frontoparietal lobes  when using the Tmax greater than 4 seconds threshold). Mismatch Volume: 48m64mnfarction Location:None identified No emergent large vessel occlusion. Severe stenosis within the distal cavernous/paraclinoid left ICA. Severe stenosis within a proximal to mid M2 left MCA vessel. These results were communicated to Dr. BhaAndrey Farmer06 pmon 11/13/2022by text page via the [facility] system. IMPRESSION: CTA neck: 1. The common carotid and internal carotid arteries are patent within the neck without stenosis. Minimal calcified plaque within the carotid bifurcations and proximal ICAs, bilaterally. 2. Streak and beam hardening artifact arising from a dense left-sided contrast bolus partially obscures the V1 left vertebral artery. Within this limitation, the vertebral arteries are patent within the neck without appreciable stenosis. CTA head: 1. No intracranial large vessel occlusion. 2. Intracranial atherosclerotic disease with multifocal stenoses, most notably as follows. 3. Severe stenosis within the distal cavernous/paraclinoid left ICA. 4. Severe stenosis within a proximal-to-mid M2 left MCA vessel. 5. Up to moderate stenosis within the distal cavernous/paraclinoid right ICA. CT perfusion head: The perfusion software identifies no core infarct. The perfusion software identifies no critically hypoperfused parenchyma utilizing the Tmax>6 seconds threshold. However, there is a 5 mL region of hypoperfusion within the left frontoparietal lobes when utilizing the T-max greater than 4 seconds threshold. The perfusion software reports no mismatch volume. Electronically Signed   By: KylKellie SimmeringO.   On: 06/17/2021 14:07   IR ANGIO INTRA EXTRACRAN SEL INTERNAL CAROTID BILAT MOD SED  Result Date: 06/19/2021 INDICATION: Philip Brooks a [age]ar old male with past medical history significant for colon cancer 0 presented to emergency with altered mental status, right right-sided weakness and neglect. MRI of the brain was  positive for left ACA/MCA watershed infarcts and CT angiogram showed high-grade stenosis of the intracranial left ICA. Comes to our service for a diagnostic cerebral angiogram to evaluate intracranial stenosis. EXAM: ULTRASOUND-GUIDED VASCULAR ACCESS DIAGNOSTIC CEREBRAL ANGIOGRAM COMPARISON:  CTA head and neck June 17, 2021. MEDICATIONS: No antibiotics but. ANESTHESIA/SEDATION: Moderate (conscious) sedation was employed during this procedure.  A total of Versed 1 mg and Fentanyl 25 mcg was administered intravenously by the radiology nurse. Total intra-service moderate Sedation Time: 44 minutes. The patient's level of consciousness and vital signs were monitored continuously by radiology nursing throughout the procedure under my direct supervision. CONTRAST:  65 mL of Omnipaque 300 milligram/mL FLUOROSCOPY TIME:  Fluoroscopy Time: 9 minutes 24 seconds (994 mGy). COMPLICATIONS: None immediate. TECHNIQUE: Informed written consent was obtained from the patient after a thorough discussion of the procedural risks, benefits and alternatives. All questions were addressed. Maximal Sterile Barrier Technique was utilized including caps, mask, sterile gowns, sterile gloves, sterile drape, hand hygiene and skin antiseptic. A timeout was performed prior to the initiation of the procedure. The right groin was prepped and draped in the usual sterile fashion. Using a micropuncture kit and the modified Seldinger technique, access was gained to the right common femoral artery and a 5 French sheath was placed. Real-time ultrasound guidance was utilized for vascular access including the acquisition of a permanent ultrasound image documenting patency of the accessed vessel. Under fluoroscopy, a 5 [place] Berenstein 2 catheter and a 0.035" Terumo Glidewire into the aortic arch. The catheter was placed into the right common carotid artery. Frontal and lateral angiograms of the neck under biplane roadmap, the catheter was advanced into  the right internal carotid artery. Frontal, lateral, magnified right anterior oblique and magnified lateral views of the head were obtained. The catheter was then retracted and then advanced into the right subclavian artery. Under roadmap guidance, the catheter was advanced into the right vertebral artery. Townes and lateral views of the head were obtained. The catheter was then retracted and then advanced into the left subclavian artery. Under roadmap guidance, the catheter was advanced into the left vertebral artery. Townes and lateral views of the head were obtained. The catheter was pulled back and then advanced into the left common carotid artery. Frontal and lateral angiograms of the neck were obtained. Under biplane roadmap, the catheter was advanced into the left internal carotid artery. Frontal, lateral, magnified bilateral oblique, magnified lateral and magnified waters views of the head were obtained. The catheter was subsequently withdrawn. A common femoral artery angiogram was obtained in right anterior oblique view via sheath side port. Then, a 5 [place] Exoseal was utilized for access closure. Adequate hemostasis achieved. FINDINGS: Right CCA angiograms: Cervical angiograms show mild atherosclerotic changes at the distal right carotid bulb without hemodynamically significant stenosis. Right ICA angiograms: There is brisk vascular contrast filling of the right ACA and MCA vascular trees. There is also brisk vascular contrast opacification of the right PCA vascular tree by prominent posterior communicating artery. Mild luminal irregularity along the cavernous and supraclinoid right ICA with mild, non flow-limiting stenosis of the ophthalmic segment. No aneurysms or abnormally high-flow, early draining veins are seen. No regions of abnormal hypervascularity are noted. The visualized dural sinuses are patent. Right vertebral artery angiograms: The non dominant right vertebral artery, basilar artery, and  left posterior cerebral artery are unremarkable. Luminal irregularity with mild stenosis at the proximal P2 segment of the right posterior cerebral artery. No aneurysms or abnormally high-flow, early draining veins are seen. No regions of abnormal hypervascularity are noted. The visualized dural sinuses are patent. Left vertebral artery angiograms: The dominant left vertebral artery, basilar artery, and left posterior cerebral artery are unremarkable. Luminal irregularity with mild stenosis at the proximal P2 segment of the right posterior cerebral artery. No aneurysms or abnormally high-flow, early draining veins are seen. No regions  of abnormal hypervascularity are noted. The visualized dural sinuses are patent. Left CCA angiograms: Mild atherosclerotic changes of the distal left carotid bulb without hemodynamically significant stenosis. Left ICA angiograms: Luminal irregularity is seen from the petrous to terminus left ICA, more pronounced and at the ophthalmic/communicating segment where irregular plaque results in approximately 70% stenosis. There is brisk vascular contrast filling of the left ACA and MCA vascular trees without evidence of significant atherosclerotic disease, hemodynamically significant stenosis or occlusion. No aneurysms or abnormally high-flow, early draining veins are seen. No regions of abnormal hypervascularity are noted. The visualized dural sinuses are patent. Right common femoral artery angiogram and ultrasound: The access is at the level of the mid right common femoral artery. Mild atherosclerotic changes are seen in the distal right common femoral artery without hemodynamically significant stenosis. The right common femoral artery has adequate for vascular access and for closure device utilization. PROCEDURE: No intervention performed. IMPRESSION: 1. 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 2. No  evidence of occlusion or hemodynamically significant stenosis of the left MCA territory. 3. Atherosclerotic changes of the right PCA with mild stenosis at the P2 segment. 4. Mild atherosclerotic disease in the bilateral carotid bifurcation without hemodynamically significant stenosis. PLAN: Case discussed with Dr. Lottie Rater. Decision made to proceed with aggressive medical management. In case of treatment failure, patient will be considered for intracranial angioplasty and stenting. Electronically Signed   By: Pedro Earls M.D.   On: 06/19/2021 10:30   IR ANGIO VERTEBRAL SEL VERTEBRAL BILAT MOD SED  Result Date: 06/19/2021 INDICATION: Philip Brooks is a 70 year old male with past medical history significant for colon cancer 0 presented to emergency with altered mental status, right right-sided weakness and neglect. MRI of the brain was positive for left ACA/MCA watershed infarcts and CT angiogram showed high-grade stenosis of the intracranial left ICA. Comes to our service for a diagnostic cerebral angiogram to evaluate intracranial stenosis. EXAM: ULTRASOUND-GUIDED VASCULAR ACCESS DIAGNOSTIC CEREBRAL ANGIOGRAM COMPARISON:  CTA head and neck June 17, 2021. MEDICATIONS: No antibiotics but. ANESTHESIA/SEDATION: Moderate (conscious) sedation was employed during this procedure. A total of Versed 1 mg and Fentanyl 25 mcg was administered intravenously by the radiology nurse. Total intra-service moderate Sedation Time: 44 minutes. The patient's level of consciousness and vital signs were monitored continuously by radiology nursing throughout the procedure under my direct supervision. CONTRAST:  65 mL of Omnipaque 300 milligram/mL FLUOROSCOPY TIME:  Fluoroscopy Time: 9 minutes 24 seconds (994 mGy). COMPLICATIONS: None immediate. TECHNIQUE: Informed written consent was obtained from the patient after a thorough discussion of the procedural risks, benefits and alternatives. All questions were  addressed. Maximal Sterile Barrier Technique was utilized including caps, mask, sterile gowns, sterile gloves, sterile drape, hand hygiene and skin antiseptic. A timeout was performed prior to the initiation of the procedure. The right groin was prepped and draped in the usual sterile fashion. Using a micropuncture kit and the modified Seldinger technique, access was gained to the right common femoral artery and a 5 French sheath was placed. Real-time ultrasound guidance was utilized for vascular access including the acquisition of a permanent ultrasound image documenting patency of the accessed vessel. Under fluoroscopy, a 5 [place] Berenstein 2 catheter and a 0.035" Terumo Glidewire into the aortic arch. The catheter was placed into the right common carotid artery. Frontal and lateral angiograms of the neck under biplane roadmap, the catheter was advanced into the right internal carotid  artery. Frontal, lateral, magnified right anterior oblique and magnified lateral views of the head were obtained. The catheter was then retracted and then advanced into the right subclavian artery. Under roadmap guidance, the catheter was advanced into the right vertebral artery. Townes and lateral views of the head were obtained. The catheter was then retracted and then advanced into the left subclavian artery. Under roadmap guidance, the catheter was advanced into the left vertebral artery. Townes and lateral views of the head were obtained. The catheter was pulled back and then advanced into the left common carotid artery. Frontal and lateral angiograms of the neck were obtained. Under biplane roadmap, the catheter was advanced into the left internal carotid artery. Frontal, lateral, magnified bilateral oblique, magnified lateral and magnified waters views of the head were obtained. The catheter was subsequently withdrawn. A common femoral artery angiogram was obtained in right anterior oblique view via sheath side port. Then, a  5 [place] Exoseal was utilized for access closure. Adequate hemostasis achieved. FINDINGS: Right CCA angiograms: Cervical angiograms show mild atherosclerotic changes at the distal right carotid bulb without hemodynamically significant stenosis. Right ICA angiograms: There is brisk vascular contrast filling of the right ACA and MCA vascular trees. There is also brisk vascular contrast opacification of the right PCA vascular tree by prominent posterior communicating artery. Mild luminal irregularity along the cavernous and supraclinoid right ICA with mild, non flow-limiting stenosis of the ophthalmic segment. No aneurysms or abnormally high-flow, early draining veins are seen. No regions of abnormal hypervascularity are noted. The visualized dural sinuses are patent. Right vertebral artery angiograms: The non dominant right vertebral artery, basilar artery, and left posterior cerebral artery are unremarkable. Luminal irregularity with mild stenosis at the proximal P2 segment of the right posterior cerebral artery. No aneurysms or abnormally high-flow, early draining veins are seen. No regions of abnormal hypervascularity are noted. The visualized dural sinuses are patent. Left vertebral artery angiograms: The dominant left vertebral artery, basilar artery, and left posterior cerebral artery are unremarkable. Luminal irregularity with mild stenosis at the proximal P2 segment of the right posterior cerebral artery. No aneurysms or abnormally high-flow, early draining veins are seen. No regions of abnormal hypervascularity are noted. The visualized dural sinuses are patent. Left CCA angiograms: Mild atherosclerotic changes of the distal left carotid bulb without hemodynamically significant stenosis. Left ICA angiograms: Luminal irregularity is seen from the petrous to terminus left ICA, more pronounced and at the ophthalmic/communicating segment where irregular plaque results in approximately 70% stenosis. There is brisk  vascular contrast filling of the left ACA and MCA vascular trees without evidence of significant atherosclerotic disease, hemodynamically significant stenosis or occlusion. No aneurysms or abnormally high-flow, early draining veins are seen. No regions of abnormal hypervascularity are noted. The visualized dural sinuses are patent. Right common femoral artery angiogram and ultrasound: The access is at the level of the mid right common femoral artery. Mild atherosclerotic changes are seen in the distal right common femoral artery without hemodynamically significant stenosis. The right common femoral artery has adequate for vascular access and for closure device utilization. PROCEDURE: No intervention performed. IMPRESSION: 1. 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 2. No evidence of occlusion or hemodynamically significant stenosis of the left MCA territory. 3. Atherosclerotic changes of the right PCA with mild stenosis at the P2 segment. 4. Mild atherosclerotic disease in the bilateral carotid bifurcation without hemodynamically significant stenosis. PLAN: Case discussed with Dr. Lottie Rater. Decision made to  proceed with aggressive medical management. In case of treatment failure, patient will be considered for intracranial angioplasty and stenting. Electronically Signed   By: Pedro Earls M.D.   On: 06/19/2021 10:30    12-lead ECG on arrival showed NSR with IVCD (personally reviewed) All prior EKG's in [facility] reviewed with no documented atrial fibrillation  Telemetry NSR (personally reviewed)  Assessment and Plan:  1. Cryptogenic stroke The patient presents with cryptogenic stroke.  The patient does not have a TEE planned.  I spoke at length with the patient about monitoring for afib with an implantable loop recorder.  Risks, benefits, and alteratives to implantable loop recorder were discussed with the  patient today.   At this time, the patient is very clear in their decision to proceed with implantable loop recorder.  2. Tobacco and Substance abuse UDS cocaine +. He denies frequent use and says this was a "rare" thing. The stroke has him scared and he wants to "do everything he can" to live.   Smoking and substance cessation advised  Wound care was reviewed with the patient (keep incision clean and dry for 3 days).  Wound check scheduled and entered in AVS. Please call with questions.    Shirley Friar, PA-C 06/19/2021 10:57 AM  I have seen, examined the patient, and reviewed the above assessment and plan.  Changes to above are made where necessary.  On exam, RRR.   The patient presents with cryptogenic stroke.   I spoke at length with the patient about monitoring for afib with an implantable loop recorder.  Risks, benefits, and alteratives to implantable loop recorder were discussed with the patient today.   At this time, the patient is very clear in their decision to proceed with implantable loop recorder.   Please call with questions.   Co Sign: Thompson Grayer, MD 06/19/2021 4:03 PM

## 2021-06-19 NOTE — Discharge Instructions (Signed)

## 2021-06-19 NOTE — TOC CAGE-AID Note (Signed)
Transition of Care [facility]) - CAGE-AID Screening   Patient Details  Name: Philip Brooks MRN: [id number] Date of Birth: 05/28/1951  Transition of Care [facility]) CM/SW Contact:    Gaetano Hawthorne Tarpley-Carter, [place] Phone Number: 06/19/2021, 2:54 PM   Clinical Narrative: Pt participated in [place].  Pt denied substance use, and stated he does not use ETOH.  Pt was offered resources, pt rejected resources stating he does not use substance.  CSW will provide resources to pt, due to substance use test results.  Tanylah Schnoebelen Tarpley-Carter, MSW, LCSW-A Pronouns:  She/Her/Hers [facility] HealthTransitions of Care Clinical Social Worker Direct Number:  [phone] [email or web address]   CAGE-AID Screening:    Have You Ever Felt You Ought to Cut Down on Your Drinking or Drug Use?: No Have People Annoyed You By [employer] Your Drinking Or Drug Use?: No Have You Felt Bad Or Guilty About Your Drinking Or Drug Use?: No Have You Ever Had a Drink or Used Drugs First Thing In The Morning to Steady Your Nerves or to Get Rid of a Hangover?: No CAGE-AID Score: 0  Substance Abuse Education Offered: Yes  Substance abuse interventions: Scientist, clinical (histocompatibility and immunogenetics)

## 2021-06-19 NOTE — Progress Notes (Signed)
Pt taken down for procedure.

## 2021-06-20 ENCOUNTER — Encounter (HOSPITAL_COMMUNITY): Payer: Self-pay | Admitting: Internal Medicine

## 2021-07-05 ENCOUNTER — Ambulatory Visit: Payer: Medicare Other

## 2021-07-23 ENCOUNTER — Ambulatory Visit (INDEPENDENT_AMBULATORY_CARE_PROVIDER_SITE_OTHER): Payer: Medicare Other

## 2021-07-23 DIAGNOSIS — I639 Cerebral infarction, unspecified: Secondary | ICD-10-CM

## 2021-07-24 LAB — CUP PACEART REMOTE DEVICE CHECK
Date Time Interrogation Session: 20221219084658
Implantable Pulse Generator Implant Date: 20221115

## 2021-08-02 NOTE — Progress Notes (Signed)
Carelink Summary Report / Loop Recorder 

## 2021-08-27 ENCOUNTER — Ambulatory Visit (INDEPENDENT_AMBULATORY_CARE_PROVIDER_SITE_OTHER): Payer: Medicare PPO

## 2021-08-27 DIAGNOSIS — I639 Cerebral infarction, unspecified: Secondary | ICD-10-CM

## 2021-08-27 LAB — CUP PACEART REMOTE DEVICE CHECK
Date Time Interrogation Session: 20230122230357
Implantable Pulse Generator Implant Date: 20221115

## 2021-09-07 NOTE — Progress Notes (Signed)
Carelink Summary Report / Loop Recorder 

## 2021-09-30 LAB — CUP PACEART REMOTE DEVICE CHECK
Date Time Interrogation Session: 20230224230541
Implantable Pulse Generator Implant Date: 20221115

## 2021-10-01 ENCOUNTER — Ambulatory Visit (INDEPENDENT_AMBULATORY_CARE_PROVIDER_SITE_OTHER): Payer: Medicare PPO

## 2021-10-01 DIAGNOSIS — I639 Cerebral infarction, unspecified: Secondary | ICD-10-CM

## 2021-10-03 ENCOUNTER — Encounter (HOSPITAL_COMMUNITY): Payer: Self-pay | Admitting: Radiology

## 2021-10-08 NOTE — Progress Notes (Signed)
Carelink Summary Report / Loop Recorder 

## 2021-11-05 ENCOUNTER — Ambulatory Visit (INDEPENDENT_AMBULATORY_CARE_PROVIDER_SITE_OTHER): Payer: Medicare PPO

## 2021-11-05 DIAGNOSIS — I639 Cerebral infarction, unspecified: Secondary | ICD-10-CM

## 2021-11-06 LAB — CUP PACEART REMOTE DEVICE CHECK
Date Time Interrogation Session: 20230331230523
Implantable Pulse Generator Implant Date: 20221115

## 2021-11-20 NOTE — Progress Notes (Signed)
Carelink Summary Report / Loop Recorder 

## 2021-12-06 LAB — CUP PACEART REMOTE DEVICE CHECK
Date Time Interrogation Session: 20230503230752
Implantable Pulse Generator Implant Date: 20221115

## 2021-12-10 ENCOUNTER — Ambulatory Visit (INDEPENDENT_AMBULATORY_CARE_PROVIDER_SITE_OTHER): Payer: Medicare PPO

## 2021-12-10 DIAGNOSIS — I639 Cerebral infarction, unspecified: Secondary | ICD-10-CM | POA: Diagnosis not present

## 2021-12-28 NOTE — Progress Notes (Signed)
Carelink Summary Report / Loop Recorder 

## 2022-01-14 ENCOUNTER — Ambulatory Visit (INDEPENDENT_AMBULATORY_CARE_PROVIDER_SITE_OTHER): Payer: Medicare PPO

## 2022-01-14 DIAGNOSIS — I639 Cerebral infarction, unspecified: Secondary | ICD-10-CM

## 2022-01-16 LAB — CUP PACEART REMOTE DEVICE CHECK
Date Time Interrogation Session: 20230605230600
Implantable Pulse Generator Implant Date: 20221115

## 2022-01-30 NOTE — Progress Notes (Signed)
Carelink Summary Report / Loop Recorder 

## 2022-02-14 LAB — CUP PACEART REMOTE DEVICE CHECK
Date Time Interrogation Session: 20230708230346
Implantable Pulse Generator Implant Date: 20221115

## 2022-02-18 ENCOUNTER — Ambulatory Visit (INDEPENDENT_AMBULATORY_CARE_PROVIDER_SITE_OTHER): Payer: Medicare PPO

## 2022-02-18 DIAGNOSIS — I639 Cerebral infarction, unspecified: Secondary | ICD-10-CM | POA: Diagnosis not present

## 2022-03-21 NOTE — Progress Notes (Signed)
Carelink Summary Report / Loop Recorder 

## 2022-03-25 ENCOUNTER — Ambulatory Visit (INDEPENDENT_AMBULATORY_CARE_PROVIDER_SITE_OTHER): Payer: Medicare PPO

## 2022-03-25 DIAGNOSIS — I639 Cerebral infarction, unspecified: Secondary | ICD-10-CM | POA: Diagnosis not present

## 2022-03-26 LAB — CUP PACEART REMOTE DEVICE CHECK
Date Time Interrogation Session: 20230820230437
Implantable Pulse Generator Implant Date: 20221115

## 2022-04-22 NOTE — Progress Notes (Signed)
Carelink Summary Report / Loop Recorder 

## 2022-04-29 ENCOUNTER — Ambulatory Visit (INDEPENDENT_AMBULATORY_CARE_PROVIDER_SITE_OTHER): Payer: Medicare PPO

## 2022-04-29 DIAGNOSIS — I639 Cerebral infarction, unspecified: Secondary | ICD-10-CM | POA: Diagnosis not present

## 2022-04-30 LAB — CUP PACEART REMOTE DEVICE CHECK
Date Time Interrogation Session: 20230922230147
Implantable Pulse Generator Implant Date: 20221115

## 2022-05-14 NOTE — Progress Notes (Signed)
Carelink Summary Report / Loop Recorder 

## 2022-06-03 ENCOUNTER — Ambulatory Visit (INDEPENDENT_AMBULATORY_CARE_PROVIDER_SITE_OTHER): Payer: Medicare PPO

## 2022-06-03 DIAGNOSIS — I639 Cerebral infarction, unspecified: Secondary | ICD-10-CM | POA: Diagnosis not present

## 2022-06-04 LAB — CUP PACEART REMOTE DEVICE CHECK
Date Time Interrogation Session: 20231025230353
Implantable Pulse Generator Implant Date: 20221115

## 2022-07-06 NOTE — Progress Notes (Signed)
Carelink Summary Report / Loop Recorder 

## 2022-07-08 ENCOUNTER — Ambulatory Visit (INDEPENDENT_AMBULATORY_CARE_PROVIDER_SITE_OTHER): Payer: Medicare PPO

## 2022-07-08 DIAGNOSIS — I639 Cerebral infarction, unspecified: Secondary | ICD-10-CM

## 2022-07-08 LAB — CUP PACEART REMOTE DEVICE CHECK
Date Time Interrogation Session: 20231203230759
Implantable Pulse Generator Implant Date: 20221115

## 2022-08-12 ENCOUNTER — Ambulatory Visit (INDEPENDENT_AMBULATORY_CARE_PROVIDER_SITE_OTHER): Payer: Medicare PPO

## 2022-08-12 DIAGNOSIS — I639 Cerebral infarction, unspecified: Secondary | ICD-10-CM | POA: Diagnosis not present

## 2022-08-13 LAB — CUP PACEART REMOTE DEVICE CHECK
Date Time Interrogation Session: 20240107231347
Implantable Pulse Generator Implant Date: 20221115

## 2022-08-15 NOTE — Progress Notes (Signed)
Carelink Summary Report / Loop Recorder 

## 2022-09-15 LAB — CUP PACEART REMOTE DEVICE CHECK
Date Time Interrogation Session: 20240209230528
Implantable Pulse Generator Implant Date: 20221115

## 2022-09-16 ENCOUNTER — Ambulatory Visit: Payer: Medicare PPO

## 2022-09-16 DIAGNOSIS — I639 Cerebral infarction, unspecified: Secondary | ICD-10-CM | POA: Diagnosis not present

## 2022-09-17 NOTE — Progress Notes (Signed)
Carelink Summary Report / Loop Recorder 

## 2022-10-21 ENCOUNTER — Ambulatory Visit (INDEPENDENT_AMBULATORY_CARE_PROVIDER_SITE_OTHER): Payer: Medicare PPO

## 2022-10-21 DIAGNOSIS — I639 Cerebral infarction, unspecified: Secondary | ICD-10-CM

## 2022-10-22 LAB — CUP PACEART REMOTE DEVICE CHECK
Date Time Interrogation Session: 20240317231323
Implantable Pulse Generator Implant Date: 20221115

## 2022-10-30 NOTE — Progress Notes (Signed)
Carelink Summary Report / Loop Recorder 

## 2022-11-01 ENCOUNTER — Ambulatory Visit
Admission: EM | Admit: 2022-11-01 | Discharge: 2022-11-01 | Disposition: A | Discharge status: Home / Self Care | Payer: Medicare PPO | Attending: Family Medicine | Admitting: Family Medicine

## 2022-11-01 ENCOUNTER — Ambulatory Visit (INDEPENDENT_AMBULATORY_CARE_PROVIDER_SITE_OTHER): Payer: Medicare PPO

## 2022-11-01 DIAGNOSIS — S62326A Displaced fracture of shaft of fifth metacarpal bone, right hand, initial encounter for closed fracture: Secondary | ICD-10-CM

## 2022-11-01 DIAGNOSIS — M79641 Pain in right hand: Secondary | ICD-10-CM | POA: Diagnosis not present

## 2022-11-01 MED ORDER — KETOROLAC TROMETHAMINE 30 MG/ML IJ SOLN
30.0000 mg | Freq: Once | INTRAMUSCULAR | Status: AC
  Administered 2022-11-01: 30 mg via INTRAMUSCULAR

## 2022-11-01 MED ORDER — HYDROCODONE-ACETAMINOPHEN 5-325 MG PO TABS: 1.0000 | ORAL_TABLET | Freq: Four times a day (QID) | ORAL | 0 refills | Status: AC | PRN

## 2022-11-01 NOTE — Discharge Instructions (Signed)
You have a broken bone near the knuckle of your fifth finger  Hydrocodone 5 mg--1 tablet every 6 hours as needed for pain.  This is best taken with food.  It can cause sleepiness or dizziness  You have been given a shot of Toradol 30 mg today.

## 2022-11-01 NOTE — ED Triage Notes (Signed)
Pt states approx 2 hours ago he was swinging hand and struck hand on decking. Unable to make fist. Significant swelling to outer R hand.

## 2022-11-01 NOTE — ED Provider Notes (Signed)
[facility]    CSN: [id number] Arrival date & time: 11/01/22  1327      History   Chief Complaint Chief Complaint  Patient presents with   Hand Injury    HPI Philip Brooks. is a 72 y.o. male.    Hand Injury  Here for pain in right hand  Got startled, and struck his right hand on the decking at his house.  He now has pain and swelling on the distal dorsum of the right hand just proximal and around his fifth MCP joint   Patient Active Problem List   Diagnosis Date Noted   Acute ischemic stroke ([place]) 06/17/2021    Past Surgical History:  Procedure Laterality Date   IR ANGIO INTRA EXTRACRAN SEL INTERNAL CAROTID BILAT MOD SED  06/18/2021   IR ANGIO VERTEBRAL SEL VERTEBRAL BILAT MOD SED  06/18/2021   IR US GUIDE VASC ACCESS RIGHT  06/18/2021   LOOP RECORDER INSERTION N/A 06/19/2021   Procedure: LOOP RECORDER INSERTION;  Surgeon: Thompson Grayer, MD;  Location: [place] [facility];  Service: Cardiovascular;  Laterality: N/A;       Home Medications    Prior to Admission medications   Medication Sig Start Date End Date Taking? Authorizing Provider  aspirin EC 325 MG EC tablet Take 1 tablet (325 mg total) by mouth daily. 06/20/21  Yes Lacinda Axon, MD  atorvastatin (LIPITOR) 40 MG tablet Take 1 tablet (40 mg total) by mouth daily. 06/20/21  Yes Lacinda Axon, MD  clopidogrel (PLAVIX) 75 MG tablet Take 1 tablet (75 mg total) by mouth daily. 06/20/21  Yes Lacinda Axon, MD  HYDROcodone-acetaminophen (NORCO/VICODIN) 5-325 MG tablet Take 1 tablet by mouth every 6 (six) hours as needed (pain). 11/01/22  Yes Barrett Henle, MD  sildenafil (VIAGRA) 100 MG tablet Take 100 mg by mouth daily as needed for erectile dysfunction.   Yes [provider]    Family History Family History  Problem Relation Age of Onset   Diabetes Mother    Cancer Father     Social History Social History   Tobacco Use   Smoking status: Every Day     Packs/day: 1.00    Years: 43.00    Additional pack years: 0.00    Total pack years: 43.00    Types: Cigarettes  Vaping Use   Vaping Use: Never used  Substance Use Topics   Alcohol use: Never   Drug use: Not Currently    Types: Marijuana    Comment: occ     Allergies   Lactose   Review of Systems Review of Systems   Physical Exam Triage Vital Signs ED Triage Vitals  Enc Vitals Group     BP 11/01/22 1425 137/67     Pulse Rate 11/01/22 1425 74     Resp 11/01/22 1425 16     Temp 11/01/22 1425 97.8 F (36.6 C)     Temp Source 11/01/22 1425 Oral     SpO2 11/01/22 1425 96 %     Weight --      Height --      Head Circumference --      Peak Flow --      Pain Score 11/01/22 1419 0     Pain Loc --      Pain Edu? --      Excl. in [place]? --    No data found.  Updated Vital Signs BP 137/67 (BP Location: Right Arm)  Pulse 74   Temp 97.8 F (36.6 C) (Oral)   Resp 16   SpO2 96%   Visual Acuity Right Eye Distance:   Left Eye Distance:   Bilateral Distance:    Right Eye Near:   Left Eye Near:    Bilateral Near:     Physical Exam Constitutional:      General: He is not in acute distress.    Appearance: He is not ill-appearing, toxic-appearing or diaphoretic.  Musculoskeletal:     Comments: There is swelling and tenderness around the distal metacarpal on the right hand on the ulnar side.  Skin:    Coloration: Skin is not jaundiced or pale.  Neurological:     General: No focal deficit present.     Mental Status: He is alert and oriented to person, place, and time.  Psychiatric:        Behavior: Behavior normal.      UC Treatments / Results  Labs (all labs ordered are listed, but only abnormal results are displayed) Labs Reviewed - No data to display  EKG   Radiology DG Hand Complete Right  Result Date: 11/01/2022 CLINICAL DATA:  Struck hand on decking EXAM: RIGHT HAND - COMPLETE 3 VIEW COMPARISON:  None Available. FINDINGS: Osteopenia. Comminuted,  moderately displaced fracture of the distal fifth metacarpal with volar and radial angulation. Degenerative joint space narrowing in the hand. No additional fracture. No suspicious osseous lesion. IMPRESSION: Comminuted, moderately displaced fracture of the distal fifth metacarpal with volar and radial angulation. Electronically Signed   By: Merilyn Baba M.D.   On: 11/01/2022 14:51    Procedures Procedures (including critical care time)  Medications Ordered in UC Medications  ketorolac (TORADOL) 30 MG/ML injection 30 mg (has no administration in time range)    Initial Impression / Assessment and Plan / UC Course  I have reviewed the triage vital signs and the nursing notes.  Pertinent labs & imaging results that were available during my care of the patient were reviewed by me and considered in my medical decision making (see chart for details).       X-ray shows a comminuted angulated fracture of the distal fifth metacarpal on the right hand.   I spoke with Dr. Ala Bent, who is on-call for hand.  He agreed with applying an ulnar gutter splint and he will see him in follow-up.  Toradol was given here and hydrocodone is sent in for pain.  Ulnar gutter splint is applied Final Clinical Impressions(s) / UC Diagnoses   Final diagnoses:  Closed displaced fracture of shaft of fifth metacarpal bone of right hand, initial encounter     Discharge Instructions      You have a broken bone near the knuckle of your fifth finger  Hydrocodone 5 mg--1 tablet every 6 hours as needed for pain.  This is best taken with food.  It can cause sleepiness or dizziness  You have been given a shot of Toradol 30 mg today.       ED Prescriptions     Medication Sig Dispense Auth. Provider   HYDROcodone-acetaminophen (NORCO/VICODIN) 5-325 MG tablet Take 1 tablet by mouth every 6 (six) hours as needed (pain). 12 tablet Haelie Clapp, Gwenlyn Perking, MD      I have reviewed the PDMP during this  encounter.   Barrett Henle, MD 11/01/22 [id number]

## 2022-11-25 ENCOUNTER — Ambulatory Visit (INDEPENDENT_AMBULATORY_CARE_PROVIDER_SITE_OTHER): Payer: Medicare PPO

## 2022-11-25 DIAGNOSIS — I639 Cerebral infarction, unspecified: Secondary | ICD-10-CM

## 2022-11-26 LAB — CUP PACEART REMOTE DEVICE CHECK
Date Time Interrogation Session: 20240419230424
Implantable Pulse Generator Implant Date: 20221115

## 2022-12-02 NOTE — Progress Notes (Signed)
Carelink Summary Report / Loop Recorder 

## 2022-12-25 ENCOUNTER — Ambulatory Visit (INDEPENDENT_AMBULATORY_CARE_PROVIDER_SITE_OTHER): Payer: Medicare PPO

## 2022-12-25 DIAGNOSIS — I639 Cerebral infarction, unspecified: Secondary | ICD-10-CM

## 2022-12-26 LAB — CUP PACEART REMOTE DEVICE CHECK
Date Time Interrogation Session: 20240522230054
Implantable Pulse Generator Implant Date: 20221115

## 2023-01-01 NOTE — Progress Notes (Signed)
Carelink Summary Report / Loop Recorder 

## 2023-01-15 NOTE — Progress Notes (Signed)
Carelink Summary Report / Loop Recorder 

## 2023-01-27 ENCOUNTER — Ambulatory Visit: Payer: Medicare PPO

## 2023-01-27 DIAGNOSIS — I639 Cerebral infarction, unspecified: Secondary | ICD-10-CM | POA: Diagnosis not present

## 2023-01-28 LAB — CUP PACEART REMOTE DEVICE CHECK
Date Time Interrogation Session: 20240624230441
Implantable Pulse Generator Implant Date: 20221115

## 2023-02-17 NOTE — Progress Notes (Signed)
 Carelink Summary Report / Loop Recorder 

## 2023-03-03 ENCOUNTER — Ambulatory Visit (INDEPENDENT_AMBULATORY_CARE_PROVIDER_SITE_OTHER): Payer: Medicare PPO

## 2023-03-03 DIAGNOSIS — I639 Cerebral infarction, unspecified: Secondary | ICD-10-CM | POA: Diagnosis not present

## 2023-03-03 LAB — CUP PACEART REMOTE DEVICE CHECK
Date Time Interrogation Session: 20240727230215
Implantable Pulse Generator Implant Date: 20221115

## 2023-03-20 NOTE — Progress Notes (Signed)
Carelink Summary Report / Loop Recorder 

## 2023-10-04 IMAGING — US IR CAROTID INTERNAL HEAD/NECK BILAT  (MS)
1 series · 1 of 1 positions shown · non-contrast
Comparison: CTA head and neck June 17, 2021.

INDICATION: Higinus Tobby is a 70 year old male with past medical history
significant for colon cancer 0 presented to emergency with altered
mental status, right right-sided weakness and neglect. MRI of the
brain was positive for left ACA/MCA watershed infarcts and CT
angiogram showed high-grade stenosis of the intracranial left ICA.
Comes to our service for a diagnostic cerebral angiogram to evaluate
intracranial stenosis.

EXAM:
ULTRASOUND-GUIDED VASCULAR [REDACTED] CEREBRAL ANGIOGRAM
TECHNIQUE: Informed written consent was obtained from the patient after a
thorough discussion of the procedural risks, benefits and
alternatives. All questions were addressed.

[Series 1: ir (id) (id) · 1 of 1 slices shown]
[im 1/1]
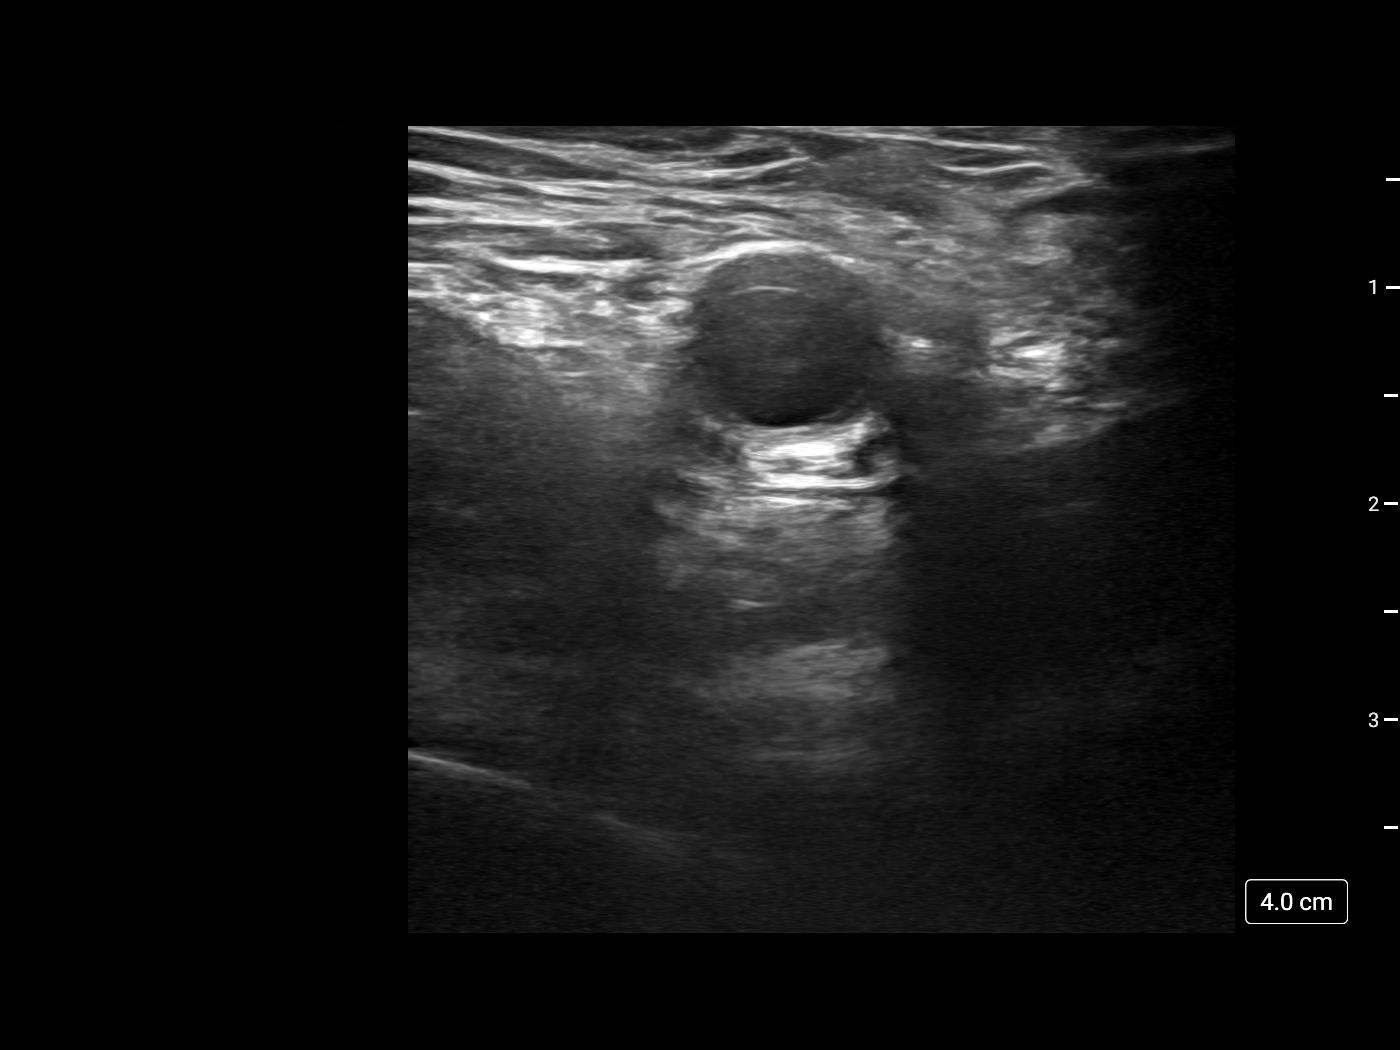

[1 of 1 positions shown; findings below may reference images not displayed]

MEDICATIONS:
No antibiotics but.

ANESTHESIA/SEDATION:
Moderate (conscious) sedation was employed during this procedure. A
total of Versed 1 mg and Fentanyl 25 mcg was administered
intravenously by the radiology nurse.

Total intra-service moderate Sedation Time: 44 minutes. The
patient's level of consciousness and vital signs were monitored
continuously by radiology nursing throughout the procedure under my
direct supervision.

CONTRAST:  65 mL of Omnipaque 300 milligram/mL

FLUOROSCOPY TIME:  Fluoroscopy Time: 9 minutes 24 seconds (994 mGy).

COMPLICATIONS:
None immediate.
Maximal Sterile Barrier Technique was utilized including caps, mask,
sterile gowns, sterile gloves, sterile drape, hand hygiene and skin
antiseptic. A timeout was performed prior to the initiation of the
procedure.

The right groin was prepped and draped in the usual sterile fashion.
Using a micropuncture kit and the modified Seldinger technique,
access was gained to the right common femoral artery and a 5 French
sheath was placed. Real-time ultrasound guidance was utilized for
vascular access including the acquisition of a permanent ultrasound
image documenting patency of the accessed vessel.

Under fluoroscopy, a 5 Patrick A Adamson 2 catheter and a 0.035"
Terumo Glidewire into the aortic arch. The catheter was placed into
the right common carotid artery. Frontal and lateral angiograms of
the neck under biplane roadmap, the catheter was advanced into the
right internal carotid artery. Frontal, lateral, magnified right
anterior oblique and magnified lateral views of the head were
obtained.

The catheter was then retracted and then advanced into the right
subclavian artery. Under roadmap guidance, the catheter was advanced
into the right vertebral artery. Townes and lateral views of the
head were obtained.

The catheter was then retracted and then advanced into the left
subclavian artery. Under roadmap guidance, the catheter was advanced
into the left vertebral artery. Townes and lateral views of the head
were obtained.

The catheter was pulled back and then advanced into the left common
carotid artery. Frontal and lateral angiograms of the neck were
obtained. Under biplane roadmap, the catheter was advanced into the
left internal carotid artery. Frontal, lateral, magnified bilateral
oblique, magnified lateral and magnified waters views of the head
were obtained. The catheter was subsequently withdrawn.

A common femoral artery angiogram was obtained in right anterior
oblique view via sheath side port. Then, a 5 French Exoseal was
utilized for access closure. Adequate hemostasis achieved.
FINDINGS: Right CCA angiograms: Cervical angiograms show mild atherosclerotic
changes at the distal right carotid bulb without hemodynamically
significant stenosis.

Right ICA angiograms: There is brisk vascular contrast filling of
the right ACA and MCA vascular trees. There is also brisk vascular
contrast opacification of the right PCA vascular tree by prominent
posterior communicating artery. Mild luminal irregularity along the
cavernous and supraclinoid right ICA with mild, non flow-limiting
stenosis of the ophthalmic segment. No aneurysms or abnormally
high-flow, early draining veins are seen. No regions of abnormal
hypervascularity are noted. The visualized dural sinuses are patent.

Right vertebral artery angiograms: The non dominant right vertebral
artery, basilar artery, and left posterior cerebral artery are
unremarkable. Luminal irregularity with mild stenosis at the
proximal P2 segment of the right posterior cerebral artery. No
aneurysms or abnormally high-flow, early draining veins are seen. No
regions of abnormal hypervascularity are noted. The visualized dural
sinuses are patent.

Left vertebral artery angiograms: The dominant left vertebral
artery, basilar artery, and left posterior cerebral artery are
unremarkable. Luminal irregularity with mild stenosis at the
proximal P2 segment of the right posterior cerebral artery. No
aneurysms or abnormally high-flow, early draining veins are seen. No
regions of abnormal hypervascularity are noted. The visualized dural
sinuses are patent.

Left CCA angiograms: Mild atherosclerotic changes of the distal left
carotid bulb without hemodynamically significant stenosis.

Left ICA angiograms: Luminal irregularity is seen from the petrous
to terminus left ICA, more pronounced and at the
ophthalmic/communicating segment where irregular plaque results in
approximately 70% stenosis. There is brisk vascular contrast filling
of the left ACA and MCA vascular trees without evidence of
significant atherosclerotic disease, hemodynamically significant
stenosis or occlusion. No aneurysms or abnormally high-flow, early
draining veins are seen. No regions of abnormal hypervascularity are
noted. The visualized dural sinuses are patent.

Right common femoral artery angiogram and ultrasound: The access is
at the level of the mid right common femoral artery. Mild
atherosclerotic changes are seen in the distal right common femoral
artery without hemodynamically significant stenosis. The right
common femoral artery has adequate for vascular access and for
closure device utilization.

PROCEDURE:
No intervention performed.
IMPRESSION: 1. Atherosclerotic disease of the intracranial left ICA with
approximately 70% stenosis at the ophthalmic/communicating segment.
2. No evidence of occlusion or hemodynamically significant stenosis
of the left MCA territory.
3. Atherosclerotic changes of the right PCA with mild stenosis at
the P2 segment.
4. Mild atherosclerotic disease in the bilateral carotid bifurcation
without hemodynamically significant stenosis.

PLAN:
Case discussed with Quirijn. Alborosie Basaez. Decision made to proceed with
aggressive medical management. In case of treatment failure, patient
will be considered for intracranial angioplasty and stenting.

## 2024-02-12 ENCOUNTER — Encounter

## 2024-03-15 ENCOUNTER — Encounter

## 2024-04-15 ENCOUNTER — Encounter

## 2024-05-17 ENCOUNTER — Encounter

## 2024-06-17 ENCOUNTER — Encounter

## 2024-07-18 ENCOUNTER — Encounter

## 2024-08-18 ENCOUNTER — Encounter

## 2024-09-18 ENCOUNTER — Encounter

## 2024-10-19 ENCOUNTER — Encounter
